# Patient Record
Sex: Female | Born: 1945
Health system: Southern US, Community
[De-identification: ages and names within clinical notes are randomized; demographics above are authoritative.]

## PROBLEM LIST (undated history)

## (undated) DIAGNOSIS — C3490 Malignant neoplasm of unspecified part of unspecified bronchus or lung: Secondary | ICD-10-CM

---

## 2015-07-18 ENCOUNTER — Ambulatory Visit (HOSPITAL_COMMUNITY)
Admission: RE | Admit: 2015-07-18 | Discharge: 2015-07-18 | Disposition: A | Discharge status: Home / Self Care | Payer: Medicare Other | Source: Ambulatory Visit | Attending: Internal Medicine | Admitting: Internal Medicine

## 2015-07-18 ENCOUNTER — Other Ambulatory Visit (HOSPITAL_COMMUNITY): Payer: Self-pay | Admitting: Internal Medicine

## 2015-07-18 DIAGNOSIS — R059 Cough, unspecified: Secondary | ICD-10-CM

## 2015-07-18 DIAGNOSIS — Z1231 Encounter for screening mammogram for malignant neoplasm of breast: Secondary | ICD-10-CM

## 2015-07-18 DIAGNOSIS — R05 Cough: Secondary | ICD-10-CM

## 2015-07-18 DIAGNOSIS — J984 Other disorders of lung: Secondary | ICD-10-CM | POA: Diagnosis not present

## 2015-07-18 DIAGNOSIS — J449 Chronic obstructive pulmonary disease, unspecified: Secondary | ICD-10-CM | POA: Diagnosis not present

## 2015-07-28 ENCOUNTER — Ambulatory Visit (HOSPITAL_COMMUNITY)
Admission: RE | Admit: 2015-07-28 | Discharge: 2015-07-28 | Disposition: A | Discharge status: Home / Self Care | Payer: Medicare Other | Source: Ambulatory Visit | Attending: Internal Medicine | Admitting: Internal Medicine

## 2015-07-28 DIAGNOSIS — Z1231 Encounter for screening mammogram for malignant neoplasm of breast: Secondary | ICD-10-CM | POA: Diagnosis not present

## 2015-08-02 ENCOUNTER — Other Ambulatory Visit: Payer: Self-pay | Admitting: Internal Medicine

## 2015-08-02 DIAGNOSIS — R928 Other abnormal and inconclusive findings on diagnostic imaging of breast: Secondary | ICD-10-CM

## 2015-08-23 ENCOUNTER — Ambulatory Visit (HOSPITAL_COMMUNITY)
Admission: RE | Admit: 2015-08-23 | Discharge: 2015-08-23 | Disposition: A | Discharge status: Home / Self Care | Payer: Medicare Other | Source: Ambulatory Visit | Attending: Internal Medicine | Admitting: Internal Medicine

## 2015-08-23 DIAGNOSIS — R928 Other abnormal and inconclusive findings on diagnostic imaging of breast: Secondary | ICD-10-CM | POA: Diagnosis present

## 2015-10-07 DIAGNOSIS — M8580 Other specified disorders of bone density and structure, unspecified site: Secondary | ICD-10-CM | POA: Diagnosis not present

## 2015-10-07 DIAGNOSIS — E039 Hypothyroidism, unspecified: Secondary | ICD-10-CM | POA: Diagnosis not present

## 2015-10-07 DIAGNOSIS — M79674 Pain in right toe(s): Secondary | ICD-10-CM | POA: Diagnosis not present

## 2015-10-07 DIAGNOSIS — J45909 Unspecified asthma, uncomplicated: Secondary | ICD-10-CM | POA: Diagnosis not present

## 2015-10-07 DIAGNOSIS — S90414A Abrasion, right lesser toe(s), initial encounter: Secondary | ICD-10-CM | POA: Diagnosis not present

## 2016-01-20 DIAGNOSIS — Z1389 Encounter for screening for other disorder: Secondary | ICD-10-CM | POA: Diagnosis not present

## 2016-01-20 DIAGNOSIS — Z299 Encounter for prophylactic measures, unspecified: Secondary | ICD-10-CM | POA: Diagnosis not present

## 2016-01-20 DIAGNOSIS — E039 Hypothyroidism, unspecified: Secondary | ICD-10-CM | POA: Diagnosis not present

## 2016-01-20 DIAGNOSIS — Z1211 Encounter for screening for malignant neoplasm of colon: Secondary | ICD-10-CM | POA: Diagnosis not present

## 2016-01-20 DIAGNOSIS — Z7189 Other specified counseling: Secondary | ICD-10-CM | POA: Diagnosis not present

## 2016-01-20 DIAGNOSIS — Z6822 Body mass index (BMI) 22.0-22.9, adult: Secondary | ICD-10-CM | POA: Diagnosis not present

## 2016-01-20 DIAGNOSIS — E7800 Pure hypercholesterolemia, unspecified: Secondary | ICD-10-CM | POA: Diagnosis not present

## 2016-01-20 DIAGNOSIS — Z0000 Encounter for general adult medical examination without abnormal findings: Secondary | ICD-10-CM | POA: Diagnosis not present

## 2016-01-20 DIAGNOSIS — Z79899 Other long term (current) drug therapy: Secondary | ICD-10-CM | POA: Diagnosis not present

## 2016-02-14 DIAGNOSIS — E2839 Other primary ovarian failure: Secondary | ICD-10-CM | POA: Diagnosis not present

## 2016-02-15 ENCOUNTER — Other Ambulatory Visit (HOSPITAL_COMMUNITY): Payer: Self-pay | Admitting: Internal Medicine

## 2016-02-15 DIAGNOSIS — Z09 Encounter for follow-up examination after completed treatment for conditions other than malignant neoplasm: Secondary | ICD-10-CM

## 2016-02-15 DIAGNOSIS — R921 Mammographic calcification found on diagnostic imaging of breast: Secondary | ICD-10-CM

## 2016-02-21 ENCOUNTER — Ambulatory Visit (HOSPITAL_COMMUNITY)
Admission: RE | Admit: 2016-02-21 | Discharge: 2016-02-21 | Disposition: A | Discharge status: Home / Self Care | Payer: PPO | Source: Ambulatory Visit | Attending: Internal Medicine | Admitting: Internal Medicine

## 2016-02-21 DIAGNOSIS — R921 Mammographic calcification found on diagnostic imaging of breast: Secondary | ICD-10-CM | POA: Insufficient documentation

## 2016-02-21 DIAGNOSIS — Z09 Encounter for follow-up examination after completed treatment for conditions other than malignant neoplasm: Secondary | ICD-10-CM

## 2016-05-04 DIAGNOSIS — M810 Age-related osteoporosis without current pathological fracture: Secondary | ICD-10-CM | POA: Diagnosis not present

## 2016-05-04 DIAGNOSIS — G710 Muscular dystrophy: Secondary | ICD-10-CM | POA: Diagnosis not present

## 2016-05-04 DIAGNOSIS — J45909 Unspecified asthma, uncomplicated: Secondary | ICD-10-CM | POA: Diagnosis not present

## 2016-05-04 DIAGNOSIS — E039 Hypothyroidism, unspecified: Secondary | ICD-10-CM | POA: Diagnosis not present

## 2016-07-10 DIAGNOSIS — Z6823 Body mass index (BMI) 23.0-23.9, adult: Secondary | ICD-10-CM | POA: Diagnosis not present

## 2016-07-10 DIAGNOSIS — Z299 Encounter for prophylactic measures, unspecified: Secondary | ICD-10-CM | POA: Diagnosis not present

## 2016-07-10 DIAGNOSIS — M810 Age-related osteoporosis without current pathological fracture: Secondary | ICD-10-CM | POA: Diagnosis not present

## 2016-07-10 DIAGNOSIS — E039 Hypothyroidism, unspecified: Secondary | ICD-10-CM | POA: Diagnosis not present

## 2016-07-10 DIAGNOSIS — F419 Anxiety disorder, unspecified: Secondary | ICD-10-CM | POA: Diagnosis not present

## 2016-08-03 DIAGNOSIS — M810 Age-related osteoporosis without current pathological fracture: Secondary | ICD-10-CM | POA: Diagnosis not present

## 2017-02-01 DIAGNOSIS — Z1389 Encounter for screening for other disorder: Secondary | ICD-10-CM | POA: Diagnosis not present

## 2017-02-01 DIAGNOSIS — J45909 Unspecified asthma, uncomplicated: Secondary | ICD-10-CM | POA: Diagnosis not present

## 2017-02-01 DIAGNOSIS — E7800 Pure hypercholesterolemia, unspecified: Secondary | ICD-10-CM | POA: Diagnosis not present

## 2017-02-01 DIAGNOSIS — Z79899 Other long term (current) drug therapy: Secondary | ICD-10-CM | POA: Diagnosis not present

## 2017-02-01 DIAGNOSIS — E039 Hypothyroidism, unspecified: Secondary | ICD-10-CM | POA: Diagnosis not present

## 2017-02-01 DIAGNOSIS — Z0000 Encounter for general adult medical examination without abnormal findings: Secondary | ICD-10-CM | POA: Diagnosis not present

## 2017-02-01 DIAGNOSIS — Z1211 Encounter for screening for malignant neoplasm of colon: Secondary | ICD-10-CM | POA: Diagnosis not present

## 2017-02-01 DIAGNOSIS — G710 Muscular dystrophy: Secondary | ICD-10-CM | POA: Diagnosis not present

## 2017-02-01 DIAGNOSIS — F419 Anxiety disorder, unspecified: Secondary | ICD-10-CM | POA: Diagnosis not present

## 2017-02-01 DIAGNOSIS — Z299 Encounter for prophylactic measures, unspecified: Secondary | ICD-10-CM | POA: Diagnosis not present

## 2017-02-01 DIAGNOSIS — Z7189 Other specified counseling: Secondary | ICD-10-CM | POA: Diagnosis not present

## 2017-02-01 DIAGNOSIS — R5383 Other fatigue: Secondary | ICD-10-CM | POA: Diagnosis not present

## 2017-02-13 DIAGNOSIS — M810 Age-related osteoporosis without current pathological fracture: Secondary | ICD-10-CM | POA: Diagnosis not present

## 2017-07-05 DIAGNOSIS — Z6822 Body mass index (BMI) 22.0-22.9, adult: Secondary | ICD-10-CM | POA: Diagnosis not present

## 2017-07-05 DIAGNOSIS — M810 Age-related osteoporosis without current pathological fracture: Secondary | ICD-10-CM | POA: Diagnosis not present

## 2017-07-05 DIAGNOSIS — G7100 Muscular dystrophy, unspecified: Secondary | ICD-10-CM | POA: Diagnosis not present

## 2017-07-05 DIAGNOSIS — Z299 Encounter for prophylactic measures, unspecified: Secondary | ICD-10-CM | POA: Diagnosis not present

## 2017-07-05 DIAGNOSIS — E7800 Pure hypercholesterolemia, unspecified: Secondary | ICD-10-CM | POA: Diagnosis not present

## 2017-07-05 DIAGNOSIS — E039 Hypothyroidism, unspecified: Secondary | ICD-10-CM | POA: Diagnosis not present

## 2017-08-21 DIAGNOSIS — M810 Age-related osteoporosis without current pathological fracture: Secondary | ICD-10-CM | POA: Diagnosis not present

## 2018-02-07 DIAGNOSIS — M810 Age-related osteoporosis without current pathological fracture: Secondary | ICD-10-CM | POA: Diagnosis not present

## 2018-02-07 DIAGNOSIS — Z87891 Personal history of nicotine dependence: Secondary | ICD-10-CM | POA: Diagnosis not present

## 2018-02-07 DIAGNOSIS — Z1331 Encounter for screening for depression: Secondary | ICD-10-CM | POA: Diagnosis not present

## 2018-02-07 DIAGNOSIS — Z1211 Encounter for screening for malignant neoplasm of colon: Secondary | ICD-10-CM | POA: Diagnosis not present

## 2018-02-07 DIAGNOSIS — Z1339 Encounter for screening examination for other mental health and behavioral disorders: Secondary | ICD-10-CM | POA: Diagnosis not present

## 2018-02-07 DIAGNOSIS — Z0000 Encounter for general adult medical examination without abnormal findings: Secondary | ICD-10-CM | POA: Diagnosis not present

## 2018-02-07 DIAGNOSIS — J45909 Unspecified asthma, uncomplicated: Secondary | ICD-10-CM | POA: Diagnosis not present

## 2018-02-07 DIAGNOSIS — Z79899 Other long term (current) drug therapy: Secondary | ICD-10-CM | POA: Diagnosis not present

## 2018-02-07 DIAGNOSIS — Z299 Encounter for prophylactic measures, unspecified: Secondary | ICD-10-CM | POA: Diagnosis not present

## 2018-02-07 DIAGNOSIS — E7800 Pure hypercholesterolemia, unspecified: Secondary | ICD-10-CM | POA: Diagnosis not present

## 2018-02-07 DIAGNOSIS — Z7189 Other specified counseling: Secondary | ICD-10-CM | POA: Diagnosis not present

## 2018-02-07 DIAGNOSIS — E039 Hypothyroidism, unspecified: Secondary | ICD-10-CM | POA: Diagnosis not present

## 2018-02-07 DIAGNOSIS — R5383 Other fatigue: Secondary | ICD-10-CM | POA: Diagnosis not present

## 2018-02-07 DIAGNOSIS — Z6822 Body mass index (BMI) 22.0-22.9, adult: Secondary | ICD-10-CM | POA: Diagnosis not present

## 2018-02-25 DIAGNOSIS — E2839 Other primary ovarian failure: Secondary | ICD-10-CM | POA: Diagnosis not present

## 2018-02-25 DIAGNOSIS — M818 Other osteoporosis without current pathological fracture: Secondary | ICD-10-CM | POA: Diagnosis not present

## 2018-03-06 DIAGNOSIS — Z299 Encounter for prophylactic measures, unspecified: Secondary | ICD-10-CM | POA: Diagnosis not present

## 2018-03-06 DIAGNOSIS — D235 Other benign neoplasm of skin of trunk: Secondary | ICD-10-CM | POA: Diagnosis not present

## 2018-03-06 DIAGNOSIS — Z6822 Body mass index (BMI) 22.0-22.9, adult: Secondary | ICD-10-CM | POA: Diagnosis not present

## 2018-03-19 DIAGNOSIS — M810 Age-related osteoporosis without current pathological fracture: Secondary | ICD-10-CM | POA: Diagnosis not present

## 2018-11-05 ENCOUNTER — Other Ambulatory Visit (HOSPITAL_COMMUNITY): Payer: Self-pay | Admitting: Internal Medicine

## 2018-11-05 DIAGNOSIS — R921 Mammographic calcification found on diagnostic imaging of breast: Secondary | ICD-10-CM

## 2018-11-13 ENCOUNTER — Ambulatory Visit (HOSPITAL_COMMUNITY): Payer: PPO

## 2018-11-13 ENCOUNTER — Encounter (HOSPITAL_COMMUNITY): Payer: PPO

## 2018-11-25 ENCOUNTER — Ambulatory Visit (HOSPITAL_COMMUNITY): Payer: PPO

## 2018-11-25 ENCOUNTER — Ambulatory Visit (HOSPITAL_COMMUNITY)
Admission: RE | Admit: 2018-11-25 | Discharge: 2018-11-25 | Disposition: A | Discharge status: Home / Self Care | Payer: PPO | Source: Ambulatory Visit | Attending: Internal Medicine | Admitting: Internal Medicine

## 2018-11-25 DIAGNOSIS — R5383 Other fatigue: Secondary | ICD-10-CM | POA: Diagnosis not present

## 2018-11-25 DIAGNOSIS — R921 Mammographic calcification found on diagnostic imaging of breast: Secondary | ICD-10-CM | POA: Insufficient documentation

## 2018-11-27 DIAGNOSIS — M810 Age-related osteoporosis without current pathological fracture: Secondary | ICD-10-CM | POA: Diagnosis not present

## 2019-05-13 DIAGNOSIS — Z1331 Encounter for screening for depression: Secondary | ICD-10-CM | POA: Diagnosis not present

## 2019-05-13 DIAGNOSIS — Z1211 Encounter for screening for malignant neoplasm of colon: Secondary | ICD-10-CM | POA: Diagnosis not present

## 2019-05-13 DIAGNOSIS — R5383 Other fatigue: Secondary | ICD-10-CM | POA: Diagnosis not present

## 2019-05-13 DIAGNOSIS — Z6823 Body mass index (BMI) 23.0-23.9, adult: Secondary | ICD-10-CM | POA: Diagnosis not present

## 2019-05-13 DIAGNOSIS — Z299 Encounter for prophylactic measures, unspecified: Secondary | ICD-10-CM | POA: Diagnosis not present

## 2019-05-13 DIAGNOSIS — Z79899 Other long term (current) drug therapy: Secondary | ICD-10-CM | POA: Diagnosis not present

## 2019-05-13 DIAGNOSIS — Z0000 Encounter for general adult medical examination without abnormal findings: Secondary | ICD-10-CM | POA: Diagnosis not present

## 2019-05-13 DIAGNOSIS — Z1339 Encounter for screening examination for other mental health and behavioral disorders: Secondary | ICD-10-CM | POA: Diagnosis not present

## 2019-05-13 DIAGNOSIS — E7800 Pure hypercholesterolemia, unspecified: Secondary | ICD-10-CM | POA: Diagnosis not present

## 2019-05-13 DIAGNOSIS — M810 Age-related osteoporosis without current pathological fracture: Secondary | ICD-10-CM | POA: Diagnosis not present

## 2019-05-13 DIAGNOSIS — Z7189 Other specified counseling: Secondary | ICD-10-CM | POA: Diagnosis not present

## 2019-05-13 DIAGNOSIS — E039 Hypothyroidism, unspecified: Secondary | ICD-10-CM | POA: Diagnosis not present

## 2019-05-13 DIAGNOSIS — J45909 Unspecified asthma, uncomplicated: Secondary | ICD-10-CM | POA: Diagnosis not present

## 2019-06-08 DIAGNOSIS — M810 Age-related osteoporosis without current pathological fracture: Secondary | ICD-10-CM | POA: Diagnosis not present

## 2019-06-16 DIAGNOSIS — Z6823 Body mass index (BMI) 23.0-23.9, adult: Secondary | ICD-10-CM | POA: Diagnosis not present

## 2019-06-16 DIAGNOSIS — G7100 Muscular dystrophy, unspecified: Secondary | ICD-10-CM | POA: Diagnosis not present

## 2019-06-16 DIAGNOSIS — Z85118 Personal history of other malignant neoplasm of bronchus and lung: Secondary | ICD-10-CM | POA: Diagnosis not present

## 2019-06-16 DIAGNOSIS — Z299 Encounter for prophylactic measures, unspecified: Secondary | ICD-10-CM | POA: Diagnosis not present

## 2019-06-16 DIAGNOSIS — I10 Essential (primary) hypertension: Secondary | ICD-10-CM | POA: Diagnosis not present

## 2019-06-16 DIAGNOSIS — E7800 Pure hypercholesterolemia, unspecified: Secondary | ICD-10-CM | POA: Diagnosis not present

## 2019-07-13 DIAGNOSIS — I10 Essential (primary) hypertension: Secondary | ICD-10-CM | POA: Diagnosis not present

## 2019-11-22 DIAGNOSIS — M8580 Other specified disorders of bone density and structure, unspecified site: Secondary | ICD-10-CM | POA: Diagnosis not present

## 2019-11-22 DIAGNOSIS — I10 Essential (primary) hypertension: Secondary | ICD-10-CM | POA: Diagnosis not present

## 2020-02-26 DIAGNOSIS — M810 Age-related osteoporosis without current pathological fracture: Secondary | ICD-10-CM | POA: Diagnosis not present

## 2020-03-23 DIAGNOSIS — G7100 Muscular dystrophy, unspecified: Secondary | ICD-10-CM | POA: Diagnosis not present

## 2020-03-23 DIAGNOSIS — E039 Hypothyroidism, unspecified: Secondary | ICD-10-CM | POA: Diagnosis not present

## 2020-04-03 IMAGING — MG DIGITAL DIAGNOSTIC BILATERAL MAMMOGRAM WITH TOMO AND CAD
6 of 10 series · 6 of 26 positions shown · non-contrast
Comparison: Previous exam(s).

CLINICAL DATA: Follow-up for probably benign left breast
calcifications. Patient has a sister recently diagnosed with stage
IV breast cancer.

EXAM:
DIGITAL DIAGNOSTIC BILATERAL MAMMOGRAM WITH CAD AND TOMO

[L CC]
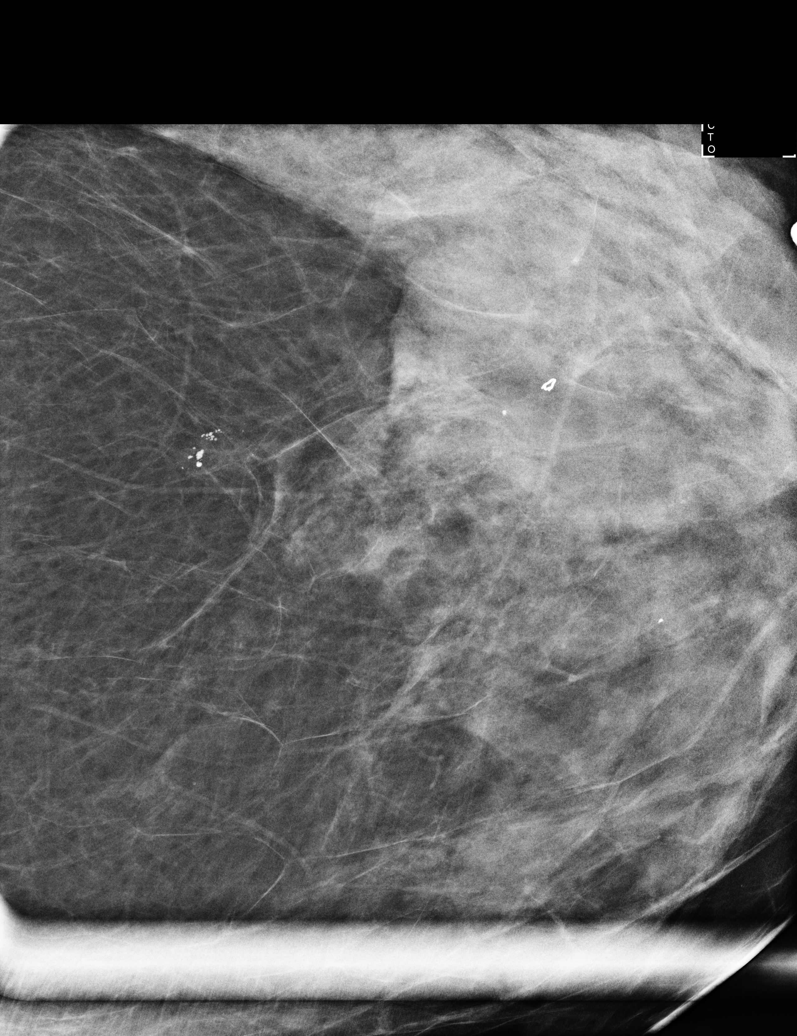

[L ML]
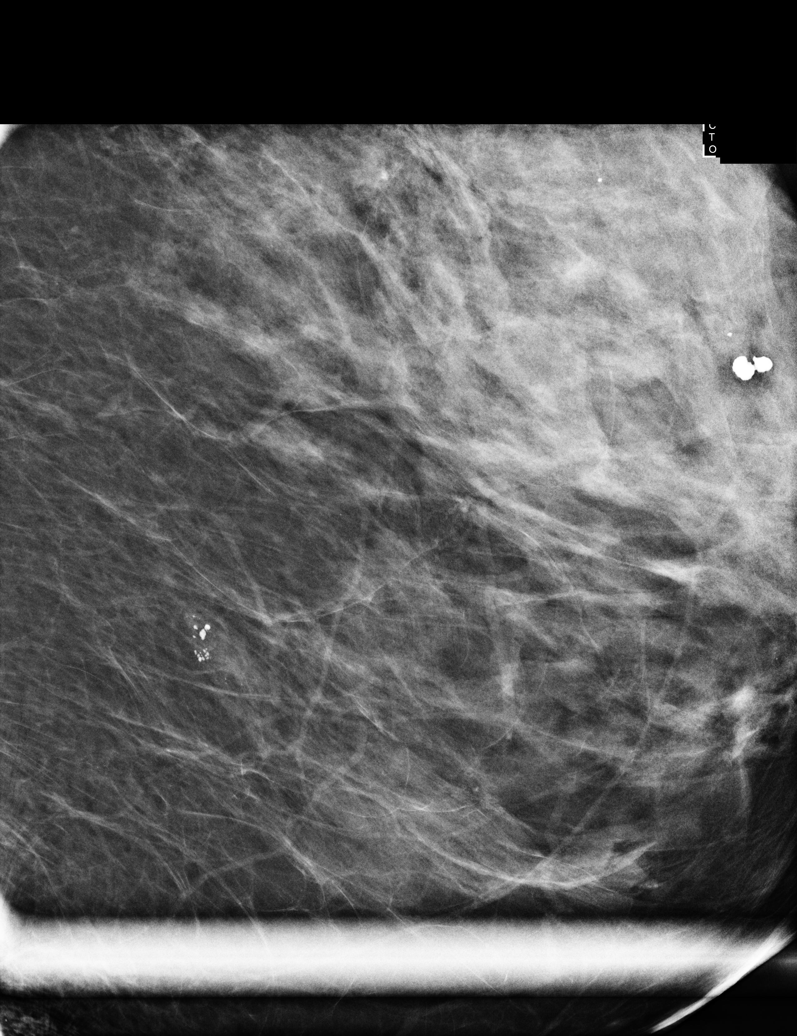

[L CC synth-2D]
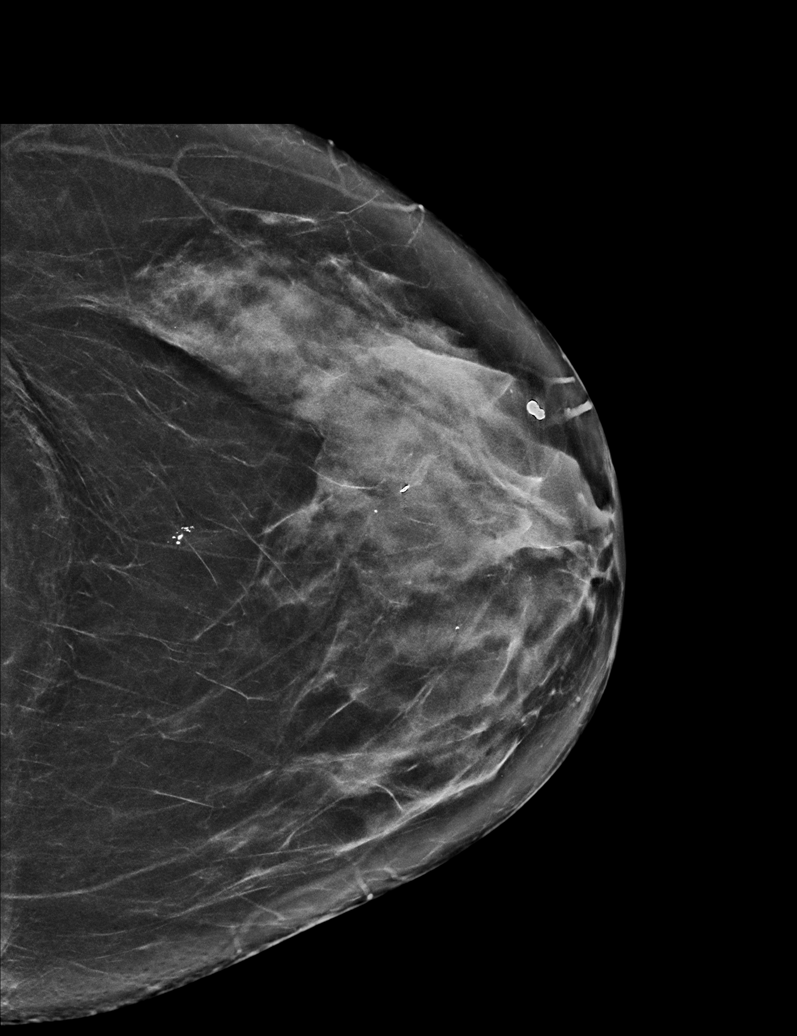

[L MLO synth-2D]
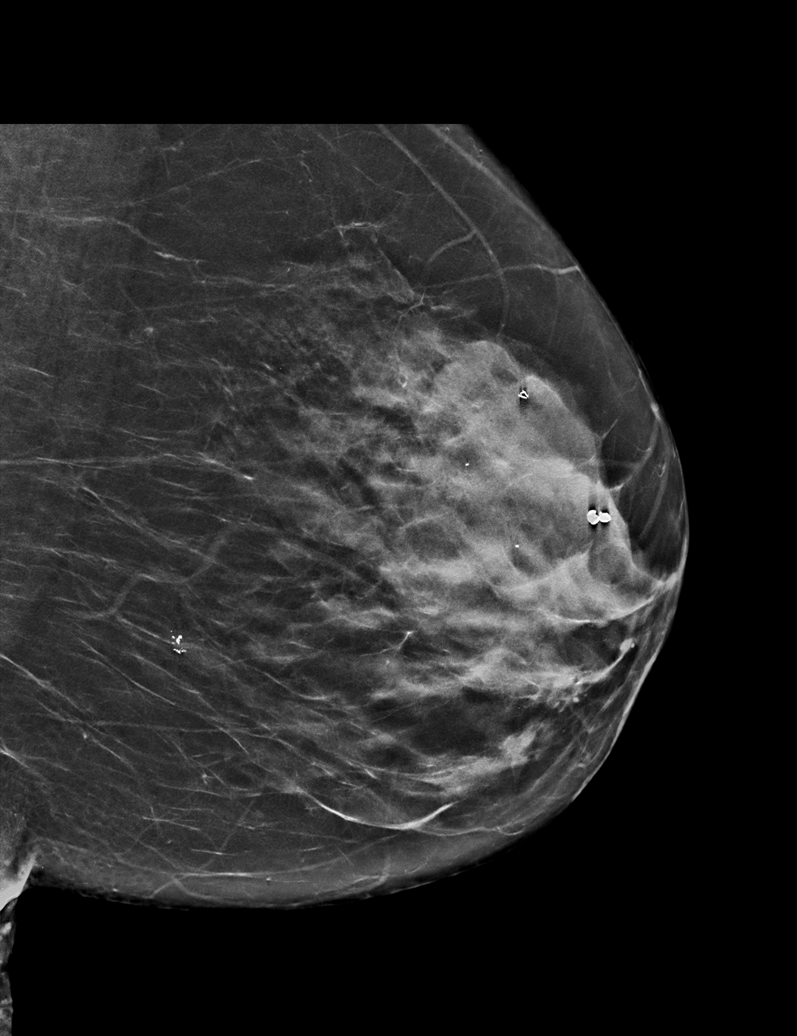

[R MLO synth-2D]
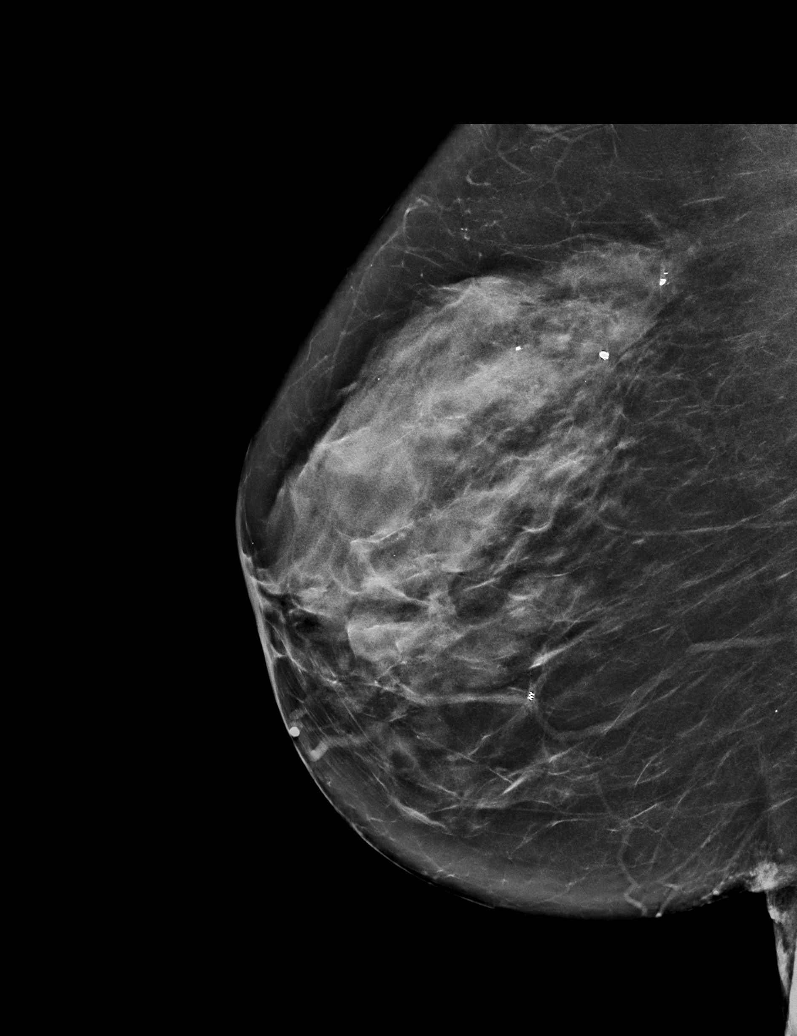

[R CC synth-2D]
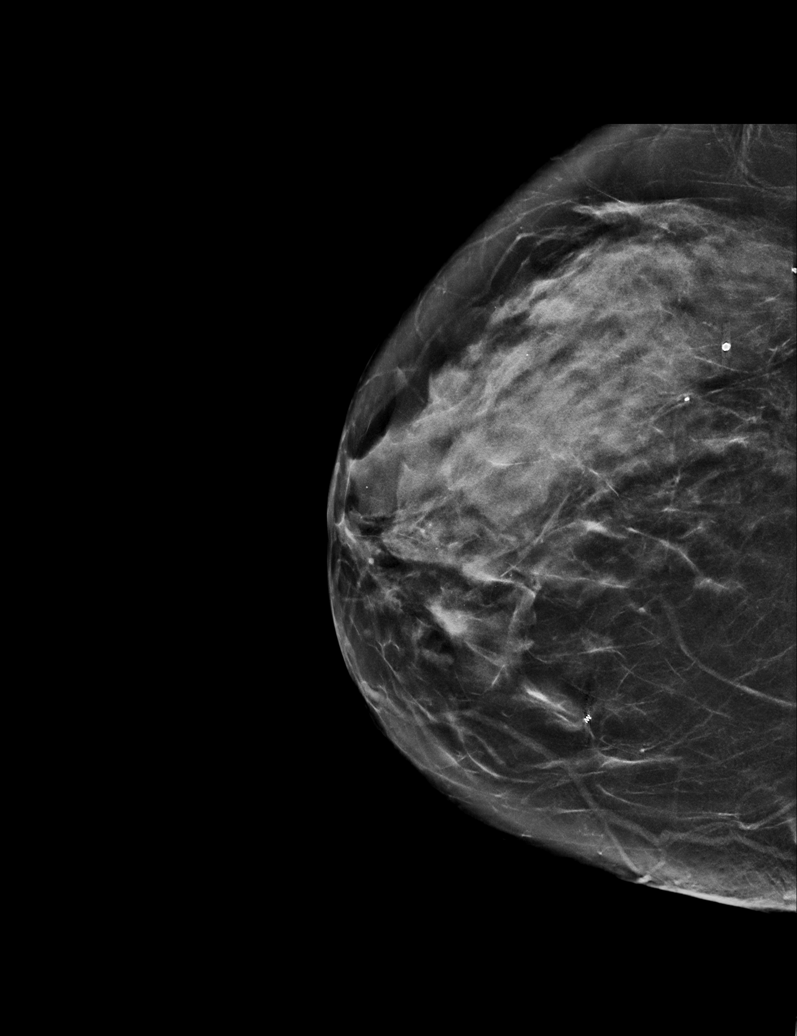

[6 of 26 positions shown; findings below may reference images not displayed]

ACR Breast Density Category c: The breast tissue is heterogeneously
dense, which may obscure small masses.
FINDINGS: No suspicious masses or calcifications are seen in either breast.
Stable postsurgical scarring in the far upper right breast related
to prior thoracotomy. Spot compression magnification views were
performed over the lower central left breast demonstrating evolution
of benign dystrophic type calcifications. There is no mammographic
evidence of malignancy in either breast.

Mammographic images were processed with CAD.
IMPRESSION: No mammographic evidence of malignancy in either breast.

RECOMMENDATION:
Screening mammogram in one year.(Code:[id number])

I have discussed the findings and recommendations with the patient.
Results were also provided in writing at the conclusion of the
visit. If applicable, a reminder letter will be sent to the patient
regarding the next appointment.

BI-RADS CATEGORY  2: Benign.

## 2020-04-22 DIAGNOSIS — G7100 Muscular dystrophy, unspecified: Secondary | ICD-10-CM | POA: Diagnosis not present

## 2020-04-22 DIAGNOSIS — E039 Hypothyroidism, unspecified: Secondary | ICD-10-CM | POA: Diagnosis not present

## 2020-06-16 ENCOUNTER — Ambulatory Visit: Payer: PPO | Attending: Internal Medicine

## 2020-06-16 DIAGNOSIS — Z23 Encounter for immunization: Secondary | ICD-10-CM

## 2020-06-16 NOTE — Progress Notes (Signed)
   Covid-19 Vaccination Clinic  Name:  Megan Lozano    MRN: [id number] DOB: [birth year]  06/16/2020  Megan Lozano was observed post Covid-19 immunization for 15 minutes without incident. She was provided with Vaccine Information Sheet and instruction to access the V-Safe system.   Megan Lozano was instructed to call 911 with any severe reactions post vaccine: Marland Kitchen Difficulty breathing  . Swelling of face and throat  . A fast heartbeat  . A bad rash all over body  . Dizziness and weakness

## 2020-06-23 DIAGNOSIS — E039 Hypothyroidism, unspecified: Secondary | ICD-10-CM | POA: Diagnosis not present

## 2020-06-23 DIAGNOSIS — G7100 Muscular dystrophy, unspecified: Secondary | ICD-10-CM | POA: Diagnosis not present

## 2020-06-29 DIAGNOSIS — Z7189 Other specified counseling: Secondary | ICD-10-CM | POA: Diagnosis not present

## 2020-06-29 DIAGNOSIS — I10 Essential (primary) hypertension: Secondary | ICD-10-CM | POA: Diagnosis not present

## 2020-06-29 DIAGNOSIS — Z79899 Other long term (current) drug therapy: Secondary | ICD-10-CM | POA: Diagnosis not present

## 2020-06-29 DIAGNOSIS — R5383 Other fatigue: Secondary | ICD-10-CM | POA: Diagnosis not present

## 2020-06-29 DIAGNOSIS — Z1331 Encounter for screening for depression: Secondary | ICD-10-CM | POA: Diagnosis not present

## 2020-06-29 DIAGNOSIS — Z0000 Encounter for general adult medical examination without abnormal findings: Secondary | ICD-10-CM | POA: Diagnosis not present

## 2020-06-29 DIAGNOSIS — E039 Hypothyroidism, unspecified: Secondary | ICD-10-CM | POA: Diagnosis not present

## 2020-06-29 DIAGNOSIS — Z299 Encounter for prophylactic measures, unspecified: Secondary | ICD-10-CM | POA: Diagnosis not present

## 2020-06-29 DIAGNOSIS — Z713 Dietary counseling and surveillance: Secondary | ICD-10-CM | POA: Diagnosis not present

## 2020-06-29 DIAGNOSIS — Z1211 Encounter for screening for malignant neoplasm of colon: Secondary | ICD-10-CM | POA: Diagnosis not present

## 2020-06-29 DIAGNOSIS — Z1339 Encounter for screening examination for other mental health and behavioral disorders: Secondary | ICD-10-CM | POA: Diagnosis not present

## 2020-07-22 DIAGNOSIS — G7100 Muscular dystrophy, unspecified: Secondary | ICD-10-CM | POA: Diagnosis not present

## 2020-07-22 DIAGNOSIS — E039 Hypothyroidism, unspecified: Secondary | ICD-10-CM | POA: Diagnosis not present

## 2020-09-23 DIAGNOSIS — E039 Hypothyroidism, unspecified: Secondary | ICD-10-CM | POA: Diagnosis not present

## 2020-09-23 DIAGNOSIS — E559 Vitamin D deficiency, unspecified: Secondary | ICD-10-CM | POA: Diagnosis not present

## 2020-11-21 DIAGNOSIS — E039 Hypothyroidism, unspecified: Secondary | ICD-10-CM | POA: Diagnosis not present

## 2020-11-21 DIAGNOSIS — E559 Vitamin D deficiency, unspecified: Secondary | ICD-10-CM | POA: Diagnosis not present

## 2021-07-04 DIAGNOSIS — M810 Age-related osteoporosis without current pathological fracture: Secondary | ICD-10-CM | POA: Diagnosis not present

## 2021-07-19 DIAGNOSIS — Z87891 Personal history of nicotine dependence: Secondary | ICD-10-CM | POA: Diagnosis not present

## 2021-07-19 DIAGNOSIS — I10 Essential (primary) hypertension: Secondary | ICD-10-CM | POA: Diagnosis not present

## 2021-07-19 DIAGNOSIS — Z299 Encounter for prophylactic measures, unspecified: Secondary | ICD-10-CM | POA: Diagnosis not present

## 2021-07-19 DIAGNOSIS — Z23 Encounter for immunization: Secondary | ICD-10-CM | POA: Diagnosis not present

## 2021-07-19 DIAGNOSIS — R1011 Right upper quadrant pain: Secondary | ICD-10-CM | POA: Diagnosis not present

## 2021-07-19 DIAGNOSIS — Z1331 Encounter for screening for depression: Secondary | ICD-10-CM | POA: Diagnosis not present

## 2021-07-19 DIAGNOSIS — Z1211 Encounter for screening for malignant neoplasm of colon: Secondary | ICD-10-CM | POA: Diagnosis not present

## 2021-07-19 DIAGNOSIS — Z6823 Body mass index (BMI) 23.0-23.9, adult: Secondary | ICD-10-CM | POA: Diagnosis not present

## 2021-07-19 DIAGNOSIS — Z7189 Other specified counseling: Secondary | ICD-10-CM | POA: Diagnosis not present

## 2021-07-19 DIAGNOSIS — Z1339 Encounter for screening examination for other mental health and behavioral disorders: Secondary | ICD-10-CM | POA: Diagnosis not present

## 2021-07-19 DIAGNOSIS — Z0000 Encounter for general adult medical examination without abnormal findings: Secondary | ICD-10-CM | POA: Diagnosis not present

## 2021-07-20 DIAGNOSIS — Z79899 Other long term (current) drug therapy: Secondary | ICD-10-CM | POA: Diagnosis not present

## 2021-07-20 DIAGNOSIS — R918 Other nonspecific abnormal finding of lung field: Secondary | ICD-10-CM | POA: Diagnosis not present

## 2021-07-20 DIAGNOSIS — Z9889 Other specified postprocedural states: Secondary | ICD-10-CM | POA: Diagnosis not present

## 2021-07-20 DIAGNOSIS — Z85118 Personal history of other malignant neoplasm of bronchus and lung: Secondary | ICD-10-CM | POA: Diagnosis not present

## 2021-07-20 DIAGNOSIS — E7800 Pure hypercholesterolemia, unspecified: Secondary | ICD-10-CM | POA: Diagnosis not present

## 2021-07-20 DIAGNOSIS — E559 Vitamin D deficiency, unspecified: Secondary | ICD-10-CM | POA: Diagnosis not present

## 2021-07-20 DIAGNOSIS — E039 Hypothyroidism, unspecified: Secondary | ICD-10-CM | POA: Diagnosis not present

## 2021-07-20 DIAGNOSIS — R079 Chest pain, unspecified: Secondary | ICD-10-CM | POA: Diagnosis not present

## 2021-07-20 DIAGNOSIS — R5383 Other fatigue: Secondary | ICD-10-CM | POA: Diagnosis not present

## 2021-07-20 DIAGNOSIS — R1011 Right upper quadrant pain: Secondary | ICD-10-CM | POA: Diagnosis not present

## 2021-07-20 DIAGNOSIS — R932 Abnormal findings on diagnostic imaging of liver and biliary tract: Secondary | ICD-10-CM | POA: Diagnosis not present

## 2021-07-20 DIAGNOSIS — S22089A Unspecified fracture of T11-T12 vertebra, initial encounter for closed fracture: Secondary | ICD-10-CM | POA: Diagnosis not present

## 2021-07-20 DIAGNOSIS — K7689 Other specified diseases of liver: Secondary | ICD-10-CM | POA: Diagnosis not present

## 2021-07-26 ENCOUNTER — Other Ambulatory Visit: Payer: Self-pay | Admitting: Internal Medicine

## 2021-07-26 DIAGNOSIS — Z139 Encounter for screening, unspecified: Secondary | ICD-10-CM

## 2021-08-01 ENCOUNTER — Ambulatory Visit
Admission: RE | Admit: 2021-08-01 | Discharge: 2021-08-01 | Disposition: A | Discharge status: Home / Self Care | Payer: PPO | Source: Ambulatory Visit | Attending: Internal Medicine | Admitting: Internal Medicine

## 2021-08-01 ENCOUNTER — Other Ambulatory Visit: Payer: Self-pay

## 2021-08-01 DIAGNOSIS — Z1231 Encounter for screening mammogram for malignant neoplasm of breast: Secondary | ICD-10-CM | POA: Diagnosis not present

## 2021-08-01 DIAGNOSIS — Z139 Encounter for screening, unspecified: Secondary | ICD-10-CM

## 2022-02-01 ENCOUNTER — Ambulatory Visit: Payer: Self-pay

## 2022-02-01 DIAGNOSIS — L98499 Non-pressure chronic ulcer of skin of other sites with unspecified severity: Secondary | ICD-10-CM | POA: Diagnosis not present

## 2022-02-01 DIAGNOSIS — S20329A Blister (nonthermal) of unspecified front wall of thorax, initial encounter: Secondary | ICD-10-CM | POA: Diagnosis not present

## 2022-02-01 DIAGNOSIS — T2121XA Burn of second degree of chest wall, initial encounter: Secondary | ICD-10-CM | POA: Diagnosis not present

## 2022-02-01 NOTE — Telephone Encounter (Signed)
? ? ? ?  Chief Complaint: Open wound to right shoulder blade ?Symptoms: Painful, looks yellow. ?Frequency: 1 month ago ?Pertinent Negatives: Patient denies fever ?Disposition: [] ED /[x] Urgent Care (no appt availability in office) / [] Appointment(In office/virtual)/ []  [facility]/ [] Home Care/ [] Refused Recommended Disposition /[] [facility]/ []  Follow-up with PCP ?Additional Notes:   ?Reason for Disposition ? [1] Looks infected AND [2] large red area (> 2 inches or 5 cm) or streak ? ?Answer Assessment - Initial Assessment Questions ?1. APPEARANCE of INJURY: "What does the injury look like?"  ?    Quarter sized ?2. SIZE: "How large is the cut?"  ?    Quarter size ?3. BLEEDING: "Is it bleeding now?" If Yes, ask: "Is it difficult to stop?"  ?    No ?4. LOCATION: "Where is the injury located?"  ?    Right shoulder ?5. ONSET: "How long ago did the injury occur?"  ?    1 month ago ?6. MECHANISM: "Tell me how it happened."  ?    Heating pad ?7. TETANUS: "When was the last tetanus booster?" ?    Unsure ?8. PREGNANCY: "Is there any chance you are pregnant?" "When was your last menstrual period?" ?    No ? ?Protocols used: Skin Injury-A-AH ? ?

## 2022-02-05 ENCOUNTER — Ambulatory Visit: Payer: Self-pay | Admitting: Nurse Practitioner

## 2022-02-06 ENCOUNTER — Ambulatory Visit (HOSPITAL_COMMUNITY): Payer: PPO | Attending: Internal Medicine | Admitting: Physical Therapy

## 2022-02-06 DIAGNOSIS — R2689 Other abnormalities of gait and mobility: Secondary | ICD-10-CM | POA: Insufficient documentation

## 2022-02-06 DIAGNOSIS — S21201S Unspecified open wound of right back wall of thorax without penetration into thoracic cavity, sequela: Secondary | ICD-10-CM | POA: Insufficient documentation

## 2022-02-07 DIAGNOSIS — T1490XD Injury, unspecified, subsequent encounter: Secondary | ICD-10-CM | POA: Diagnosis not present

## 2022-02-07 DIAGNOSIS — T2121XD Burn of second degree of chest wall, subsequent encounter: Secondary | ICD-10-CM | POA: Diagnosis not present

## 2022-02-07 DIAGNOSIS — T148XXD Other injury of unspecified body region, subsequent encounter: Secondary | ICD-10-CM | POA: Diagnosis not present

## 2022-02-08 ENCOUNTER — Ambulatory Visit (HOSPITAL_COMMUNITY): Payer: PPO | Admitting: Physical Therapy

## 2022-02-08 ENCOUNTER — Encounter (HOSPITAL_COMMUNITY): Payer: Self-pay | Admitting: Physical Therapy

## 2022-02-08 DIAGNOSIS — S21201S Unspecified open wound of right back wall of thorax without penetration into thoracic cavity, sequela: Secondary | ICD-10-CM

## 2022-02-08 DIAGNOSIS — R2689 Other abnormalities of gait and mobility: Secondary | ICD-10-CM

## 2022-02-08 NOTE — Therapy (Signed)
[place], [place], [address] Phone: [phone]   Fax:  [phone]  Wound Care Evaluation  Patient Details  Name: Megan Lozano MRN: [id number] Date of Birth: 10/04/1945 Referring Provider (PT): Leisa Lenz MD   Encounter Date: 02/08/2022   PT End of Session - 02/08/22 1503     Visit Number 1    Number of Visits 8    Date for PT Re-Evaluation 03/08/22    Authorization Type [employer] (no visit limit, no prior auth req.)    PT Start Time 1435    PT Stop Time 1501    PT Time Calculation (min) 26 min    Activity Tolerance Patient tolerated treatment well    Behavior During Therapy [facility] for tasks assessed/performed             History reviewed. No pertinent past medical history.  History reviewed. No pertinent surgical history.  There were no vitals filed for this visit.     [facility] PT Assessment - 02/08/22 0001       Assessment   Medical Diagnosis Abnormal wound healing on back    Referring Provider (PT) Leisa Lenz MD    Onset Date/Surgical Date 12/09/21             Wound Therapy - 02/08/22 0001     Subjective Patient had gotten wound on back about 2 months ago. She thinks it may be from having heating pad on back. She went to MD who cut stuff off wound. is now on antibiotics    Patient and Family Stated Goals wound to heal    Date of Onset 12/09/21    Prior Treatments neosporin, xeroform    Pain Scale 0-10    Pain Score 0-No pain    Evaluation and Treatment Procedures Explained to Patient/Family Yes    Evaluation and Treatment Procedures agreed to    Wound Properties Date First Assessed: 02/08/22 Time First Assessed: 1435 Wound Type: Burn Location: Back Location Orientation: Lateral;Right;Upper Wound Description (Comments): R back/axillary wound Present on Admission: Yes   Wound Image Images linked: 1    Dressing Type Impregnated gauze (bismuth)    Dressing Changed  Changed    Dressing Status Old drainage    Dressing Change Frequency PRN    Site / Wound Assessment Yellow;Red;Pink    % Wound base Red or Granulating 30%    % Wound base Yellow/Fibrinous Exudate 70%    Wound Length (cm) 1.2 cm    Wound Width (cm) 1 cm    Wound Surface Area (cm^2) 1.2 cm^2    Margins Unattached edges (unapproximated)    Drainage Amount Scant    Drainage Description Serous    Treatment Cleansed;Debridement (Selective)    Selective Debridement - Location slough in wound bed    Selective Debridement - Tools Used Forceps    Selective Debridement - Tissue Removed slough    Wound Therapy - Clinical Statement Patient with wound that developed about 2 months ago after leaving heating pad on back for too long. She had area debrided by urgent care MD and was given xeroform to dress wound with. Patient presents today with wound mostly covered in slough. Cleansed and debrided some slough but some remained as it was very adherent in wound bed. Dressed wound with medihoney on 2x2 and medipore tape. Educated patient on keeping dressing dry and clean and only changing if soiled. Gave patient medihoney incase she needed to change dressing and  told to bring back next session. Patient will benefit from PT in order to promote wound healing    Wound Therapy - Functional Problem List dressing bathing, sleeping    Factors Delaying/Impairing Wound Healing Multiple medical problems    Hydrotherapy Plan Debridement;Dressing change;Electrical stimulation;Patient/family education;Pulsatile lavage with suction    Wound Therapy - Frequency 2X / week    Wound Therapy - Current Recommendations PT    Wound Plan debride and dressing changes    Dressing  medihoney, vaseline to perimeter,  2x2, medipore                   Objective measurements completed on examination: See above findings.               PT Short Term Goals - 02/08/22 1504       PT SHORT TERM GOAL #1   Title  Patient's wound will be free from slough.    Time 2    Period Weeks    Status New    Target Date 02/22/22               PT Long Term Goals - 02/08/22 1504       PT LONG TERM GOAL #1   Title Patient's wound to be healed.    Time 4    Period Weeks    Status New    Target Date 03/08/22                    Patient will benefit from skilled therapeutic intervention in order to improve the following deficits and impairments:     Visit Diagnosis: Wound of right side of back, sequela  Other abnormalities of gait and mobility    Problem List There are no problems to display for this patient.   Vianne Bulls Perrie Ragin, PT 02/08/2022, 3:19 PM  [place] [address] [place], [place], [address] Phone: [phone]   Fax:  [phone]  Name: Megan Lozano MRN: [id number] Date of Birth: 14-Feb-1946

## 2022-02-12 ENCOUNTER — Ambulatory Visit (HOSPITAL_COMMUNITY): Payer: PPO | Admitting: Physical Therapy

## 2022-02-12 ENCOUNTER — Encounter (HOSPITAL_COMMUNITY): Payer: Self-pay | Admitting: Physical Therapy

## 2022-02-12 DIAGNOSIS — S21201S Unspecified open wound of right back wall of thorax without penetration into thoracic cavity, sequela: Secondary | ICD-10-CM | POA: Diagnosis not present

## 2022-02-12 DIAGNOSIS — R2689 Other abnormalities of gait and mobility: Secondary | ICD-10-CM

## 2022-02-12 NOTE — Therapy (Signed)
[place], [place], [address] Phone: [phone]   Fax:  [phone]  Wound Care Therapy  Patient Details  Name: Megan Lozano MRN: [id number] Date of Birth: 01-27-1946 Referring Provider (PT): Leisa Lenz MD   Encounter Date: 02/12/2022   PT End of Session - 02/12/22 1310     Visit Number 2    Number of Visits 8    Date for PT Re-Evaluation 03/08/22    Authorization Type [employer] (no visit limit, no prior auth req.)    PT Start Time 1312    PT Stop Time 1328    PT Time Calculation (min) 16 min    Activity Tolerance Patient tolerated treatment well    Behavior During Therapy [facility] for tasks assessed/performed             History reviewed. No pertinent past medical history.  History reviewed. No pertinent surgical history.  There were no vitals filed for this visit.               Wound Therapy - 02/12/22 0001     Subjective Dressing stayed in tact.    Patient and Family Stated Goals wound to heal    Date of Onset 12/09/21    Prior Treatments neosporin, xeroform    Evaluation and Treatment Procedures Explained to Patient/Family Yes    Evaluation and Treatment Procedures agreed to    Wound Properties Date First Assessed: 02/08/22 Time First Assessed: 1435 Wound Type: Burn Location: Back Location Orientation: Lateral;Right;Upper Wound Description (Comments): R back/axillary wound Present on Admission: Yes   Wound Image Images linked: 1    Dressing Type Gauze (Comment)   with medihoney   Dressing Changed Changed    Dressing Status Old drainage    Dressing Change Frequency PRN    Site / Wound Assessment Yellow;Red;Pink    % Wound base Red or Granulating 50%    % Wound base Yellow/Fibrinous Exudate 50%    Margins Unattached edges (unapproximated)    Drainage Amount Scant    Drainage Description Serous    Treatment Cleansed;Debridement (Selective)    Selective Debridement  - Location slough in wound bed    Selective Debridement - Tools Used Forceps    Selective Debridement - Tissue Removed slough    Wound Therapy - Clinical Statement Wound is improving in granulation with continued slough present. Tolerates debridement well with some adherent slough remaining. Continued with medihoney on 2x2 with medipore. Gave patient medihoney incase she needed to change dressing and told to bring back next session. Patient will benefit from PT in order to promote wound healing    Wound Therapy - Functional Problem List dressing bathing, sleeping    Factors Delaying/Impairing Wound Healing Multiple medical problems    Hydrotherapy Plan Debridement;Dressing change;Electrical stimulation;Patient/family education;Pulsatile lavage with suction    Wound Therapy - Frequency 2X / week    Wound Therapy - Current Recommendations PT    Wound Plan debride and dressing changes    Dressing  medihoney, 2x2, medipore                       PT Short Term Goals - 02/08/22 1504       PT SHORT TERM GOAL #1   Title Patient's wound will be free from slough.    Time 2    Period Weeks    Status New    Target Date 02/22/22  PT Long Term Goals - 02/08/22 1504       PT LONG TERM GOAL #1   Title Patient's wound to be healed.    Time 4    Period Weeks    Status New    Target Date 03/08/22                    Patient will benefit from skilled therapeutic intervention in order to improve the following deficits and impairments:     Visit Diagnosis: Wound of right side of back, sequela  Other abnormalities of gait and mobility     Problem List There are no problems to display for this patient.   1:41 PM, 02/12/22 Mearl Latin PT, DPT Physical Therapist at [place], [place], [address] Phone: [phone]   Fax:   [phone]  Name: Megan Lozano MRN: [id number] Date of Birth: 20-Jan-1946

## 2022-02-13 ENCOUNTER — Encounter (HOSPITAL_BASED_OUTPATIENT_CLINIC_OR_DEPARTMENT_OTHER): Payer: PPO | Admitting: Internal Medicine

## 2022-02-16 ENCOUNTER — Encounter (HOSPITAL_COMMUNITY): Payer: Self-pay

## 2022-02-16 ENCOUNTER — Ambulatory Visit (HOSPITAL_COMMUNITY): Payer: PPO | Attending: Preventative Medicine

## 2022-02-16 DIAGNOSIS — R2689 Other abnormalities of gait and mobility: Secondary | ICD-10-CM | POA: Diagnosis not present

## 2022-02-16 DIAGNOSIS — S21201S Unspecified open wound of right back wall of thorax without penetration into thoracic cavity, sequela: Secondary | ICD-10-CM | POA: Diagnosis not present

## 2022-02-16 NOTE — Therapy (Signed)
[place], [place], [address] Phone: [phone]   Fax:  [phone]  Wound Care Therapy  Patient Details  Name: Megan Lozano MRN: [id number] Date of Birth: 04-15-1946 Referring Provider (PT): Leisa Lenz MD   Encounter Date: 02/16/2022   PT End of Session - 02/16/22 1823     Visit Number 3    Number of Visits 8    Date for PT Re-Evaluation 03/08/22    Authorization Type [employer] (no visit limit, no prior auth req.)    PT Start Time 1194    PT Stop Time 1735    PT Time Calculation (min) 20 min    Activity Tolerance Patient tolerated treatment well    Behavior During Therapy [facility] for tasks assessed/performed             History reviewed. No pertinent past medical history.  History reviewed. No pertinent surgical history.  There were no vitals filed for this visit.    Subjective Assessment - 02/16/22 1819     Subjective Pt arrived with dressings intact and remember medihoney this session.  No reoprts of pain today.    Currently in Pain? No/denies                       Wound Therapy - 02/16/22 0001     Subjective Pt arrived with dressings intact and remember medihoney this session.  No reoprts of pain today.    Patient and Family Stated Goals wound to heal    Date of Onset 12/09/21    Prior Treatments neosporin, xeroform    Pain Scale 0-10    Pain Score 0-No pain    Evaluation and Treatment Procedures Explained to Patient/Family Yes    Evaluation and Treatment Procedures agreed to    Wound Properties Date First Assessed: 02/08/22 Time First Assessed: 1435 Wound Type: Burn Location: Back Location Orientation: Lateral;Right;Upper Wound Description (Comments): R back/axillary wound Present on Admission: Yes   Wound Image Images linked: 1    Dressing Type Gauze (Comment)   medihoney, vaseline perimeter, medipore tape   Dressing Changed Changed    Dressing Status Old  drainage    Dressing Change Frequency PRN    Site / Wound Assessment Yellow;Red;Pink    % Wound base Red or Granulating 60%    % Wound base Yellow/Fibrinous Exudate 40%    Wound Length (cm) 1 cm    Wound Width (cm) 0.9 cm    Wound Surface Area (cm^2) 0.9 cm^2    Margins Unattached edges (unapproximated)    Drainage Amount Scant    Drainage Description Serous    Treatment Cleansed;Debridement (Selective)    Selective Debridement - Location slough in wound bed    Selective Debridement - Tools Used Forceps;Scalpel    Selective Debridement - Tissue Removed slough    Wound Therapy - Clinical Statement Improved granulation following debridment, does continue to have adherent slough in 3 main areas of wound. Noted induration around wound, manual techniques complete to address hardness.  Continued with medihoney to address slough wiht 2x2 and medipore tape.    Wound Therapy - Functional Problem List dressing bathing, sleeping    Factors Delaying/Impairing Wound Healing Multiple medical problems    Hydrotherapy Plan Debridement;Dressing change;Electrical stimulation;Patient/family education;Pulsatile lavage with suction    Wound Therapy - Frequency 2X / week    Wound Therapy - Current Recommendations PT    Wound Plan debride and dressing changes.  May be ready to transition to xeroform next session(s)    Dressing  medihoney, 2x2, medipore                       PT Short Term Goals - 02/08/22 1504       PT SHORT TERM GOAL #1   Title Patient's wound will be free from slough.    Time 2    Period Weeks    Status New    Target Date 02/22/22               PT Long Term Goals - 02/08/22 1504       PT LONG TERM GOAL #1   Title Patient's wound to be healed.    Time 4    Period Weeks    Status New    Target Date 03/08/22                    Patient will benefit from skilled therapeutic intervention in order to improve the following deficits and impairments:      Visit Diagnosis: Wound of right side of back, sequela  Other abnormalities of gait and mobility     Problem List There are no problems to display for this patient.  Ihor Austin, LPTA/CLT; CBIS [phone]  Aldona Lento, PTA 02/16/2022, 6:24 PM  [place] [address] [place], [place], [address] Phone: [phone]   Fax:  [phone]  Name: Megan Lozano MRN: [id number] Date of Birth: 12-18-1945

## 2022-02-22 ENCOUNTER — Encounter (HOSPITAL_COMMUNITY): Payer: Self-pay

## 2022-02-22 ENCOUNTER — Ambulatory Visit (HOSPITAL_COMMUNITY): Payer: PPO | Attending: Preventative Medicine

## 2022-02-22 DIAGNOSIS — R2689 Other abnormalities of gait and mobility: Secondary | ICD-10-CM | POA: Diagnosis not present

## 2022-02-22 DIAGNOSIS — S21201S Unspecified open wound of right back wall of thorax without penetration into thoracic cavity, sequela: Secondary | ICD-10-CM | POA: Diagnosis not present

## 2022-02-22 NOTE — Therapy (Signed)
[place], [place], [address] Phone: [phone]   Fax:  [phone]  Wound Care Therapy  Patient Details  Name: Megan Lozano MRN: [id number] Date of Birth: 1946/07/21 Referring Provider (PT): Leisa Lenz MD   Encounter Date: 02/22/2022   PT End of Session - 02/22/22 1516     Visit Number 4    Number of Visits 8    Date for PT Re-Evaluation 03/08/22    Authorization Type [employer] (no visit limit, no prior auth req.)    PT Start Time 6834    PT Stop Time 1510    PT Time Calculation (min) 22 min    Activity Tolerance Patient tolerated treatment well    Behavior During Therapy [facility] for tasks assessed/performed             History reviewed. No pertinent past medical history.  History reviewed. No pertinent surgical history.  There were no vitals filed for this visit.    Subjective Assessment - 02/22/22 1512     Subjective Pt stated she mowed yesterday, a little sore following.  No reports of pain in wound today.    Currently in Pain? No/denies                       Wound Therapy - 02/22/22 0001     Subjective Pt stated she mowed yesterday, a little sore following.  No reports of pain in wound today.    Patient and Family Stated Goals wound to heal    Date of Onset 12/09/21    Prior Treatments neosporin, xeroform    Pain Scale 0-10    Pain Score 0-No pain    Evaluation and Treatment Procedures Explained to Patient/Family Yes    Evaluation and Treatment Procedures agreed to    Wound Properties Date First Assessed: 02/08/22 Time First Assessed: 1435 Wound Type: Burn Location: Back Location Orientation: Lateral;Right;Upper Wound Description (Comments): R back/axillary wound Present on Admission: Yes   Wound Image Images linked: 1    Dressing Type Bismuth petroleum;Gauze (Comment)   vaseline perimeter, xeroform 2x2, medipore tape   Dressing Changed Changed    Dressing  Status Old drainage    Dressing Change Frequency PRN    Site / Wound Assessment Pink;Red;Granulation tissue    % Wound base Red or Granulating 100%    % Wound base Yellow/Fibrinous Exudate 0%    Wound Length (cm) 0.6 cm    Wound Width (cm) 0.7 cm    Wound Surface Area (cm^2) 0.42 cm^2    Margins Unattached edges (unapproximated)    Drainage Amount Scant    Drainage Description Serous    Treatment Cleansed;Debridement (Selective)    Selective Debridement - Location slough in wound bed    Selective Debridement - Tools Used Forceps    Selective Debridement - Tissue Removed slough    Wound Therapy - Clinical Statement Vast improvements with granulation tissue following debridment, able to remove all slough from wound bed and dry skin perimeter.  Changed to xeroform with 2x2 and medipore tape.  No reports of pain through session.    Wound Therapy - Functional Problem List dressing bathing, sleeping    Factors Delaying/Impairing Wound Healing Multiple medical problems    Hydrotherapy Plan Debridement;Dressing change;Electrical stimulation;Patient/family education;Pulsatile lavage with suction    Wound Therapy - Frequency 2X / week    Wound Therapy - Current Recommendations PT    Wound Plan debride and  dressing changes.    Dressing  xeroform, vaseline perimeter, 2x2, medipore tape                       PT Short Term Goals - 02/08/22 1504       PT SHORT TERM GOAL #1   Title Patient's wound will be free from slough.    Time 2    Period Weeks    Status New    Target Date 02/22/22               PT Long Term Goals - 02/08/22 1504       PT LONG TERM GOAL #1   Title Patient's wound to be healed.    Time 4    Period Weeks    Status New    Target Date 03/08/22                    Patient will benefit from skilled therapeutic intervention in order to improve the following deficits and impairments:     Visit Diagnosis: Wound of right side of back,  sequela  Other abnormalities of gait and mobility     Problem List There are no problems to display for this patient.  Ihor Austin, LPTA/CLT; CBIS [phone]  Aldona Lento, PTA 02/22/2022, 3:17 PM  [place] [address] [place], [place], [address] Phone: [phone]   Fax:  [phone]  Name: Megan Lozano MRN: [id number] Date of Birth: 1946/07/17

## 2022-02-27 ENCOUNTER — Ambulatory Visit (HOSPITAL_COMMUNITY): Payer: PPO | Attending: Internal Medicine

## 2022-02-27 ENCOUNTER — Encounter (HOSPITAL_COMMUNITY): Payer: Self-pay

## 2022-02-27 DIAGNOSIS — S21201S Unspecified open wound of right back wall of thorax without penetration into thoracic cavity, sequela: Secondary | ICD-10-CM | POA: Insufficient documentation

## 2022-02-27 DIAGNOSIS — R2689 Other abnormalities of gait and mobility: Secondary | ICD-10-CM | POA: Insufficient documentation

## 2022-02-27 NOTE — Therapy (Addendum)
[facility] [place], [place], [address] Phone: [phone]   Fax:  [phone]  Wound Care Therapy  Patient Details  Name: Megan Lozano MRN: [id number] Date of Birth: 01-10-1946 Referring Provider (PT): Leisa Lenz MD   Encounter Date: 02/27/2022  PHYSICAL THERAPY DISCHARGE SUMMARY  Visits from Start of Care: 5  Current functional level related to goals / functional outcomes: See below   Remaining deficits: See below   Education / Equipment: See below   Patient agrees to discharge. Patient goals were partially met. Patient is being discharged due to  d/c to self care.    PT End of Session - 02/27/22 0859     Visit Number 5    Number of Visits 8    Date for PT Re-Evaluation 03/08/22    Authorization Type [employer] (no visit limit, no prior auth req.)    PT Start Time 0825    PT Stop Time 0840    PT Time Calculation (min) 15 min    Activity Tolerance Patient tolerated treatment well    Behavior During Therapy [facility] for tasks assessed/performed             History reviewed. No pertinent past medical history.  History reviewed. No pertinent surgical history.  There were no vitals filed for this visit.    Subjective Assessment - 02/27/22 0852     Subjective Pt stated pain that moves around back, no reports of pain currently.                       Wound Therapy - 02/27/22 0001     Subjective Pt stated pain that moves around back, no reports of pain currently.  \    Patient and Family Stated Goals wound to heal    Date of Onset 12/09/21    Prior Treatments neosporin, xeroform    Pain Scale 0-10    Pain Score 0-No pain    Evaluation and Treatment Procedures Explained to Patient/Family Yes    Evaluation and Treatment Procedures agreed to    Wound Properties Date First Assessed: 02/08/22 Time First Assessed: 1435 Wound Type: Burn Location: Back Location Orientation:  Lateral;Right;Upper Wound Description (Comments): R back/axillary wound Present on Admission: Yes   Wound Image Images linked: 1    Dressing Type Bismuth petroleum   Vaseline, xeroform, 2x2, medipore tape   Dressing Changed Changed    Dressing Status Old drainage    Dressing Change Frequency PRN    Site / Wound Assessment Granulation tissue    % Wound base Red or Granulating 100%    Wound Length (cm) 0.2 cm    Wound Width (cm) 0.4 cm    Wound Surface Area (cm^2) 0.08 cm^2    Margins Attached edges (approximated)    Drainage Amount None    Treatment Cleansed    Selective Debridement - Location cleansed wound, removal of dry skin superficial layer to assure wound healed    Selective Debridement - Tools Used Forceps    Wound Therapy - Clinical Statement Upon removal of dressings wound 100% granulation tissues.  Removal of superficial dry skin.  Discussed self care techniques, to continue with xeroform and to keep dry and covered until fully healed.  No skilled intervention needed.    Wound Therapy - Functional Problem List dressing bathing, sleeping    Factors Delaying/Impairing Wound Healing Multiple medical problems    Hydrotherapy Plan Debridement;Dressing change;Electrical stimulation;Patient/family education;Pulsatile lavage  with suction    Wound Therapy - Frequency 2X / week    Wound Therapy - Current Recommendations PT    Wound Plan DC to self care.    Dressing  xeroform, vaseline perimeter, 2x2, medipore tape               Assessment: Patient has met 1/1 short term goal and 0/1 long term goals with wound improving in granulation without slough present. Patient wound has decreased in size and appears to be healing well at this time. Wound is very small at this point and patient agreeable to self care. PTA educated patient on care needed to promote wound healing. Patient discharged from physical therapy at this time.   1:41 PM, 02/27/22 Mearl Latin PT, DPT Physical  Therapist at [facility]        PT Short Term Goals - 02/27/22 1338       PT SHORT TERM GOAL #1   Title Patient's wound will be free from slough.    Time 2    Period Weeks    Status Achieved    Target Date 02/22/22               PT Long Term Goals - 02/27/22 1338       PT LONG TERM GOAL #1   Title Patient's wound to be healed.    Time 4    Period Weeks    Status On-going    Target Date 03/08/22                    Patient will benefit from skilled therapeutic intervention in order to improve the following deficits and impairments:     Visit Diagnosis: Wound of right side of back, sequela  Other abnormalities of gait and mobility     Problem List There are no problems to display for this patient.  Megan Lozano, LPTA/CLT; CBIS [phone]  Megan Lozano, PT 02/27/2022, 1:38 PM  [facility] [address] [place], [place], [address] Phone: [phone]   Fax:  [phone]  Name: Megan Lozano MRN: [id number] Date of Birth: September 09, 1946

## 2022-03-01 ENCOUNTER — Ambulatory Visit (HOSPITAL_COMMUNITY): Payer: PPO

## 2022-03-06 ENCOUNTER — Ambulatory Visit (HOSPITAL_COMMUNITY): Payer: PPO | Admitting: Physical Therapy

## 2022-03-08 ENCOUNTER — Ambulatory Visit (HOSPITAL_COMMUNITY): Payer: PPO

## 2022-04-06 DIAGNOSIS — I10 Essential (primary) hypertension: Secondary | ICD-10-CM | POA: Diagnosis not present

## 2022-04-06 DIAGNOSIS — N644 Mastodynia: Secondary | ICD-10-CM | POA: Diagnosis not present

## 2022-04-06 DIAGNOSIS — R079 Chest pain, unspecified: Secondary | ICD-10-CM | POA: Diagnosis not present

## 2022-04-06 DIAGNOSIS — Z299 Encounter for prophylactic measures, unspecified: Secondary | ICD-10-CM | POA: Diagnosis not present

## 2022-04-06 DIAGNOSIS — R0789 Other chest pain: Secondary | ICD-10-CM | POA: Diagnosis not present

## 2022-04-09 ENCOUNTER — Other Ambulatory Visit (HOSPITAL_COMMUNITY): Payer: Self-pay | Admitting: Internal Medicine

## 2022-04-09 ENCOUNTER — Other Ambulatory Visit: Payer: Self-pay | Admitting: Internal Medicine

## 2022-04-09 DIAGNOSIS — R079 Chest pain, unspecified: Secondary | ICD-10-CM

## 2022-04-18 ENCOUNTER — Ambulatory Visit (HOSPITAL_COMMUNITY)
Admission: RE | Admit: 2022-04-18 | Discharge: 2022-04-18 | Disposition: A | Discharge status: Home / Self Care | Payer: PPO | Source: Ambulatory Visit | Attending: Internal Medicine | Admitting: Internal Medicine

## 2022-04-18 DIAGNOSIS — R079 Chest pain, unspecified: Secondary | ICD-10-CM | POA: Diagnosis not present

## 2022-04-18 DIAGNOSIS — I700 Atherosclerosis of aorta: Secondary | ICD-10-CM | POA: Diagnosis not present

## 2022-04-18 DIAGNOSIS — J90 Pleural effusion, not elsewhere classified: Secondary | ICD-10-CM | POA: Diagnosis not present

## 2022-04-18 DIAGNOSIS — Z85110 Personal history of malignant carcinoid tumor of bronchus and lung: Secondary | ICD-10-CM | POA: Diagnosis not present

## 2022-04-18 DIAGNOSIS — J439 Emphysema, unspecified: Secondary | ICD-10-CM | POA: Diagnosis not present

## 2022-06-28 DIAGNOSIS — R222 Localized swelling, mass and lump, trunk: Secondary | ICD-10-CM | POA: Diagnosis not present

## 2022-06-28 DIAGNOSIS — I10 Essential (primary) hypertension: Secondary | ICD-10-CM | POA: Diagnosis not present

## 2022-06-28 DIAGNOSIS — R1011 Right upper quadrant pain: Secondary | ICD-10-CM | POA: Diagnosis not present

## 2022-06-28 DIAGNOSIS — Z299 Encounter for prophylactic measures, unspecified: Secondary | ICD-10-CM | POA: Diagnosis not present

## 2022-06-28 DIAGNOSIS — R079 Chest pain, unspecified: Secondary | ICD-10-CM | POA: Diagnosis not present

## 2022-07-05 ENCOUNTER — Encounter: Payer: Self-pay | Admitting: Hematology

## 2022-07-05 ENCOUNTER — Inpatient Hospital Stay: Payer: PPO | Attending: Hematology | Admitting: Hematology

## 2022-07-05 ENCOUNTER — Other Ambulatory Visit: Payer: Self-pay

## 2022-07-05 VITALS — BP 152/93 | HR 94 | Temp 97.6°F | Resp 18 | Ht 63.19 in | Wt 143.1 lb

## 2022-07-05 DIAGNOSIS — C3490 Malignant neoplasm of unspecified part of unspecified bronchus or lung: Secondary | ICD-10-CM

## 2022-07-05 DIAGNOSIS — Z85118 Personal history of other malignant neoplasm of bronchus and lung: Secondary | ICD-10-CM | POA: Diagnosis not present

## 2022-07-05 DIAGNOSIS — Z8041 Family history of malignant neoplasm of ovary: Secondary | ICD-10-CM | POA: Diagnosis not present

## 2022-07-05 DIAGNOSIS — R222 Localized swelling, mass and lump, trunk: Secondary | ICD-10-CM

## 2022-07-05 DIAGNOSIS — Z8379 Family history of other diseases of the digestive system: Secondary | ICD-10-CM | POA: Diagnosis not present

## 2022-07-05 DIAGNOSIS — Z803 Family history of malignant neoplasm of breast: Secondary | ICD-10-CM | POA: Diagnosis not present

## 2022-07-05 DIAGNOSIS — Z79899 Other long term (current) drug therapy: Secondary | ICD-10-CM | POA: Diagnosis not present

## 2022-07-05 DIAGNOSIS — Z79890 Hormone replacement therapy (postmenopausal): Secondary | ICD-10-CM | POA: Diagnosis not present

## 2022-07-05 DIAGNOSIS — J90 Pleural effusion, not elsewhere classified: Secondary | ICD-10-CM | POA: Diagnosis not present

## 2022-07-05 DIAGNOSIS — Z8249 Family history of ischemic heart disease and other diseases of the circulatory system: Secondary | ICD-10-CM | POA: Diagnosis not present

## 2022-07-05 MED ORDER — GABAPENTIN 100 MG PO CAPS: 100.0000 mg | ORAL_CAPSULE | Freq: Three times a day (TID) | ORAL | 1 refills | Status: DC

## 2022-07-05 NOTE — Patient Instructions (Addendum)
[facility]  Discharge Instructions  You were seen and examined today by Dr. Delton Coombes. Dr. Delton Coombes is a medical oncologist, meaning that he specializes in the treatment of cancer diagnoses. Dr. Delton Coombes discussed your past medical history, family history of cancers, and the events that led to you being here today.  You were referred to Dr. Delton Coombes due to an abnormal scan that revealed a possible chest wall mass.  Dr. Delton Coombes has recommended a PET scan. A PET scan is a specialized CT scan that illuminates where there is cancer present in the body.  For your pain, Dr. Delton Coombes has recommended Gabapentin 100mg  to be taken 3 times a day.  Follow-up as scheduled after the PET scan.  Thank you for choosing [facility] to provide your oncology and hematology care.   To afford each patient quality time with our provider, please arrive at least 15 minutes before your scheduled appointment time. You may need to reschedule your appointment if you arrive late (10 or more minutes). Arriving late affects you and other patients whose appointments are after yours.  Also, if you miss three or more appointments without notifying the office, you may be dismissed from the clinic at the provider's discretion.    Again, thank you for choosing [facility].  Our hope is that these requests will decrease the amount of time that you wait before being seen by our physicians.   If you have a lab appointment with the [facility] please come in thru the Main Entrance and check in at the main information desk.           _____________________________________________________________  Should you have questions after your visit to [facility], please contact our office at [phone] and follow the prompts.  Our office hours are 8:00 a.m. to 4:30 p.m. Monday - Thursday and 8:00 a.m. to 2:30 p.m. Friday.  Please note that  voicemails left after 4:00 p.m. may not be returned until the following business day.  We are closed weekends and all major holidays.  You do have access to a nurse 24-7, just call the main number to the clinic [phone] and do not press any options, hold on the line and a nurse will answer the phone.    For prescription refill requests, have your pharmacy contact our office and allow 72 hours.    Masks are optional in the cancer centers. If you would like for your care team to wear a mask while they are taking care of you, please let them know. You may have one support person who is at least 76 years old accompany you for your appointments.

## 2022-07-05 NOTE — Progress Notes (Signed)
[facility] NOTE  Patient Care Team: Glenda Chroman, MD as PCP - General (Internal Medicine) Derek Jack, MD as Medical Oncologist (Medical Oncology) Brien Mates, RN as Oncology Nurse Navigator (Medical Oncology)  CHIEF COMPLAINTS/PURPOSE OF CONSULTATION:  Right chest wall fluid collection/mass  HISTORY OF PRESENTING ILLNESS:  Megan Lozano 76 y.o. female is seen at the request of Dr.Vyas for further work-up and management of right chest wall pain in a patient with history of right lower lobe adenocarcinoma, which was treated with right lower lobectomy on 01/02/1995 in [place].  She reportedly had another surgery in 2004 after his CT scan on 04/19/2003 showed new nodule in the right lung apex.  Both times it was followed with radiation therapy.  I do not have any of these records to confirm.  She reported right anterior and lateral chest wall pain getting worse over the last 5 months.  She fell on her doorknob and her right posterior chest wall has been hurting since then.  She is taking oxycodone 5 mg only if the pain is very severe.  She only took 2 to 3 pills till now as she is afraid of getting hooked onto them.  A CT scan of the chest was done on 04/18/2022 which did not show any evidence of recurrence confidently.  There is fluid collection in the right lateral chest wall with extensive sclerosis and fragmentation.  No obvious lymphadenopathy or breast mass reported.  She lives at home with her son.  She denies any weight loss in the last 6 months.  MEDICAL HISTORY:  History reviewed. No pertinent past medical history.  SURGICAL HISTORY: History reviewed. No pertinent surgical history.  SOCIAL HISTORY: Social History   Socioeconomic History   Marital status: Widowed    Spouse name: Not on file   Number of children: Not on file   Years of education: Not on file   Highest education level: Not on file  Occupational History   Not on file   Tobacco Use   Smoking status: Former    Types: Cigarettes   Smokeless tobacco: Never  Vaping Use   Vaping Use: Never used  Substance and Sexual Activity   Alcohol use: Not Currently    Comment: glass of wine every evening   Drug use: Not Currently   Sexual activity: Not Currently  Other Topics Concern   Not on file  Social History Narrative   Not on file   Social Determinants of Health   Financial Resource Strain: Not on file  Food Insecurity: Not on file  Transportation Needs: Not on file  Physical Activity: Not on file  Stress: Not on file  Social Connections: Not on file  Intimate Partner Violence: Not on file    FAMILY HISTORY: Family History  Problem Relation Age of Onset   Breast cancer Sister    Ovarian cancer Sister    Liver disease Sister    Heart Problems Brother     ALLERGIES:  has no allergies on file.  MEDICATIONS:  Current Outpatient Medications  Medication Sig Dispense Refill   levothyroxine (SYNTHROID) 50 MCG tablet Take 50 mcg by mouth daily.     Lido-Menthol-Methyl Sal-Camph (CBD KINGS EX) Apply topically.     oxyCODONE (OXY IR/ROXICODONE) 5 MG immediate release tablet Take 5-10 mg by mouth every 4 (four) hours as needed.     rosuvastatin (CRESTOR) 10 MG tablet Take by mouth.     trolamine salicylate (ASPERCREME) 10 %  cream Apply 1 Application topically as needed for muscle pain.     gabapentin (NEURONTIN) 100 MG capsule Take 1 capsule (100 mg total) by mouth 3 (three) times daily. 90 capsule 1   No current facility-administered medications for this visit.    REVIEW OF SYSTEMS:   Constitutional: Denies fevers, chills or abnormal night sweats Eyes: Denies blurriness of vision, double vision or watery eyes Ears, nose, mouth, throat, and face: Denies mucositis or sore throat Respiratory: Denies cough, dyspnea or wheezes Cardiovascular: Occasional palpitations with no chest pain or leg swelling. Gastrointestinal:  Denies heartburn or change in  bowel habits.  Occasional nausea present. Skin: Denies abnormal skin rashes Lymphatics: Denies new lymphadenopathy or easy bruising Neurological:Denies numbness, tingling or new weaknesses Behavioral/Psych: Mood is stable, no new changes  All other systems were reviewed with the patient and are negative.  PHYSICAL EXAMINATION: ECOG PERFORMANCE STATUS: 1 - Symptomatic but completely ambulatory  Vitals:   07/05/22 1322  BP: (!) 152/93  Pulse: 94  Resp: 18  Temp: 97.6 F (36.4 C)  SpO2: 95%   Filed Weights   07/05/22 1322  Weight: 143 lb 1.3 oz (64.9 kg)    GENERAL:alert, no distress and comfortable SKIN: skin color, texture, turgor are normal, no rashes or significant lesions EYES: normal, conjunctiva are pink and non-injected, sclera clear OROPHARYNX:no exudate, no erythema and lips, buccal mucosa, and tongue normal  NECK: supple, thyroid normal size, non-tender, without nodularity LYMPH:  no palpable lymphadenopathy in the cervical, axillary or inguinal LUNGS: clear to auscultation and percussion with normal breathing effort.  There are tenderness at the right posterior ribs and right lateral ribs. HEART: regular rate & rhythm and no murmurs and no lower extremity edema ABDOMEN:abdomen soft, non-tender and normal bowel sounds Musculoskeletal:no cyanosis of digits and no clubbing  PSYCH: alert & oriented x 3 with fluent speech NEURO: no focal motor/sensory deficits  LABORATORY DATA:  I have reviewed the data as listed No results found for: "WBC", "HGB", "HCT", "MCV", "PLT"   Chemistry   No results found for: "NA", "K", "CL", "CO2", "BUN", "CREATININE", "GLU" No results found for: "CALCIUM", "ALKPHOS", "AST", "ALT", "BILITOT"     RADIOGRAPHIC STUDIES: I have personally reviewed the radiological images as listed and agreed with the findings in the report. No results found.  ASSESSMENT:  1.  Right chest wall mass/fluid collection: - Patient reported right anterior and  lateral chest wall pain getting worse for the past 5 months. - She was evaluated by Dr. Woody Seller. - CT chest (04/18/2022): Small loculated right pleural effusion.  Sizable rim calcified fluid collection in the lateral right chest wall with extensive sclerosis and fragmentation of the posterior lateral right ribs, predominantly involving the sixth through eighth ribs.  Differential diagnosis includes postoperative sequela, chronic infection or recurrent malignancy. - She reported that she fell on a door handle in the past couple of months and started having right posterior chest wall pain also.  There is a tender area in the right posterior lower rib. - She reports shooting type of pains in the right anterior chest wall and burning type of pain in the right lower anterior ribs.  Stabbing type of pain in the right back.  2.  Social/family history: - She lives at home with her son Dorothyann Peng) who is accompanying today.  She is independent of all her activities including IADLs and ADLs.  She worked as a Network engineer prior to retirement.  Non-smoker.  Smoked half pack per day for 20  years. - Sister died of breast cancer.  Another sister had ovarian cancer.  3.  Right lung adenocarcinoma: - 01/02/1995: Right thoracoscopy, pleural biopsy, right thoracotomy and right lower lobectomy followed by radiation therapy. - 04/19/2003-new nodule in the right apex. - Per patient this was removed and she had to undergo radiation.  This was done in [place].  I do not have any records of pathology.   PLAN:  1.  Right chest wall mass/fluid collection: - We have reviewed images of the CT scan with the patient and her son. - There is no clear evidence of malignancy.  However we will do a PET CT scan to rule out any local recurrence. - RTC after the PET scan.  2.  Right chest wall pain: - She reports shooting and burning type of pain in the right anterior chest wall and right upper quadrant.  Stabbing type of pain in the  right back. - She is currently taking oxycodone 5 mg only if she has very strong pain.  She took only 2 to 3 pills till now.  She is afraid of getting addicted. - We will start her on gabapentin 100 mg 3 times daily and titrate up as needed.   Orders Placed This Encounter  Procedures   NM PET Image Initial (PI) Skull Base To Thigh    Standing Status:   Future    Standing Expiration Date:   07/05/2023    Order Specific Question:   If indicated for the ordered procedure, I authorize the administration of a radiopharmaceutical per Radiology protocol    Answer:   Yes    Order Specific Question:   Preferred imaging location?    Answer:   [facility]    Order Specific Question:   Release to patient    Answer:   Immediate    All questions were answered. The patient knows to call the clinic with any problems, questions or concerns.      Derek Jack, MD 07/05/2022 4:34 PM

## 2022-07-12 ENCOUNTER — Encounter (HOSPITAL_COMMUNITY)
Admission: RE | Admit: 2022-07-12 | Discharge: 2022-07-12 | Disposition: A | Discharge status: Home / Self Care | Payer: PPO | Source: Ambulatory Visit | Attending: Hematology | Admitting: Hematology

## 2022-07-12 ENCOUNTER — Encounter (HOSPITAL_COMMUNITY): Payer: PPO

## 2022-07-12 DIAGNOSIS — C3490 Malignant neoplasm of unspecified part of unspecified bronchus or lung: Secondary | ICD-10-CM | POA: Insufficient documentation

## 2022-07-12 MED ORDER — FLUDEOXYGLUCOSE F - 18 (FDG) INJECTION
7.6800 | Freq: Once | INTRAVENOUS | Status: AC | PRN
  Administered 2022-07-12: 7.68 via INTRAVENOUS

## 2022-07-17 ENCOUNTER — Inpatient Hospital Stay: Payer: PPO | Admitting: Hematology

## 2022-08-01 ENCOUNTER — Inpatient Hospital Stay: Payer: PPO | Attending: Hematology | Admitting: Hematology

## 2022-08-01 VITALS — BP 146/90 | HR 90 | Temp 97.4°F | Resp 18 | Wt 143.2 lb

## 2022-08-01 DIAGNOSIS — Z803 Family history of malignant neoplasm of breast: Secondary | ICD-10-CM | POA: Insufficient documentation

## 2022-08-01 DIAGNOSIS — Z87891 Personal history of nicotine dependence: Secondary | ICD-10-CM | POA: Insufficient documentation

## 2022-08-01 DIAGNOSIS — Z79899 Other long term (current) drug therapy: Secondary | ICD-10-CM | POA: Insufficient documentation

## 2022-08-01 DIAGNOSIS — Z8041 Family history of malignant neoplasm of ovary: Secondary | ICD-10-CM | POA: Diagnosis not present

## 2022-08-01 DIAGNOSIS — Z85118 Personal history of other malignant neoplasm of bronchus and lung: Secondary | ICD-10-CM

## 2022-08-01 DIAGNOSIS — Z8379 Family history of other diseases of the digestive system: Secondary | ICD-10-CM | POA: Insufficient documentation

## 2022-08-01 DIAGNOSIS — Z8249 Family history of ischemic heart disease and other diseases of the circulatory system: Secondary | ICD-10-CM | POA: Insufficient documentation

## 2022-08-01 DIAGNOSIS — G479 Sleep disorder, unspecified: Secondary | ICD-10-CM | POA: Insufficient documentation

## 2022-08-01 DIAGNOSIS — R079 Chest pain, unspecified: Secondary | ICD-10-CM | POA: Insufficient documentation

## 2022-08-01 DIAGNOSIS — Z79890 Hormone replacement therapy (postmenopausal): Secondary | ICD-10-CM | POA: Insufficient documentation

## 2022-08-01 DIAGNOSIS — R0602 Shortness of breath: Secondary | ICD-10-CM | POA: Diagnosis not present

## 2022-08-01 DIAGNOSIS — R222 Localized swelling, mass and lump, trunk: Secondary | ICD-10-CM

## 2022-08-01 MED ORDER — OXYCODONE HCL 5 MG PO TABS: 5.0000 mg | ORAL_TABLET | Freq: Three times a day (TID) | ORAL | 0 refills | Status: DC | PRN

## 2022-08-01 MED ORDER — GABAPENTIN 100 MG PO CAPS: 200.0000 mg | ORAL_CAPSULE | Freq: Three times a day (TID) | ORAL | 1 refills | Status: DC

## 2022-08-01 NOTE — Progress Notes (Signed)
Megan Lozano, [place] [address]   CLINIC:  Medical Oncology/Hematology  PCP:  Glenda Chroman, MD [address] [place] [address] [phone]   REASON FOR VISIT:  Follow-up for right chest wall lesions  PRIOR THERAPY: Right lower lobectomy and radiation therapy  NGS Results: Not done  CURRENT THERAPY: Under work-up  BRIEF ONCOLOGIC HISTORY:  Oncology History   No history exists.    CANCER STAGING: Cancer Staging  No matching staging information was found for the patient.   INTERVAL HISTORY:  Megan Lozano 76 y.o. female seen for follow-up of right posterior chest wall lesion.  She reports that she has been taking oxycodone 5 mg twice daily with gabapentin 100 mg 3 times daily and Advil.  Pain is not fully controlled.  She reports energy levels of 25%.  She has difficulty sleeping due to pain.    REVIEW OF SYSTEMS:  Review of Systems  Respiratory:  Positive for shortness of breath.   Cardiovascular:  Positive for chest pain.  Psychiatric/Behavioral:  Positive for sleep disturbance.   All other systems reviewed and are negative.    PAST MEDICAL/SURGICAL HISTORY:  No past medical history on file. No past surgical history on file.   SOCIAL HISTORY:  Social History   Socioeconomic History   Marital status: Widowed    Spouse name: Not on file   Number of children: Not on file   Years of education: Not on file   Highest education level: Not on file  Occupational History   Not on file  Tobacco Use   Smoking status: Former    Types: Cigarettes   Smokeless tobacco: Never  Vaping Use   Vaping Use: Never used  Substance and Sexual Activity   Alcohol use: Not Currently    Comment: glass of wine every evening   Drug use: Not Currently   Sexual activity: Not Currently  Other Topics Concern   Not on file  Social History Narrative   Not on file   Social Determinants of Health   Financial Resource Strain: Not on file   Food Insecurity: Not on file  Transportation Needs: Not on file  Physical Activity: Not on file  Stress: Not on file  Social Connections: Not on file  Intimate Partner Violence: Not on file    FAMILY HISTORY:  Family History  Problem Relation Age of Onset   Breast cancer Sister    Ovarian cancer Sister    Liver disease Sister    Heart Problems Brother     CURRENT MEDICATIONS:  Outpatient Encounter Medications as of 08/01/2022  Medication Sig   AREXVY 120 MCG/0.5ML injection    FLUZONE HIGH-DOSE QUADRIVALENT 0.7 ML SUSY    levothyroxine (SYNTHROID) 50 MCG tablet Take 50 mcg by mouth daily.   Lido-Menthol-Methyl Sal-Camph (CBD KINGS EX) Apply topically.   rosuvastatin (CRESTOR) 10 MG tablet Take by mouth.   trolamine salicylate (ASPERCREME) 10 % cream Apply 1 Application topically as needed for muscle pain.   [DISCONTINUED] gabapentin (NEURONTIN) 100 MG capsule Take 1 capsule (100 mg total) by mouth 3 (three) times daily.   [DISCONTINUED] oxyCODONE (OXY IR/ROXICODONE) 5 MG immediate release tablet Take 5-10 mg by mouth every 4 (four) hours as needed.   gabapentin (NEURONTIN) 100 MG capsule Take 2 capsules (200 mg total) by mouth 3 (three) times daily.   oxyCODONE (OXY IR/ROXICODONE) 5 MG immediate release tablet Take 1 tablet (5 mg total) by mouth every 8 (eight)  hours as needed.   No facility-administered encounter medications on file as of 08/01/2022.    ALLERGIES:  Not on File   PHYSICAL EXAM:  ECOG Performance status: 1  Vitals:   08/01/22 1556  BP: (!) 146/90  Pulse: 90  Resp: 18  Temp: (!) 97.4 F (36.3 C)  SpO2: 96%   Filed Weights   08/01/22 1556  Weight: 143 lb 3.2 oz (65 kg)   Physical Exam Vitals reviewed.  Constitutional:      Appearance: Normal appearance.  Pulmonary:     Effort: Pulmonary effort is normal.     Breath sounds: Normal breath sounds.  Neurological:     Mental Status: She is alert.  Psychiatric:        Mood and Affect: Mood  normal.      LABORATORY DATA:  I have reviewed the labs as listed.  CBC No results found for: "WBC", "RBC", "HGB", "HCT", "PLT", "MCV", "MCH", "MCHC", "RDW", "LYMPHSABS", "MONOABS", "EOSABS", "BASOSABS"     No data to display          DIAGNOSTIC IMAGING:  I have independently reviewed the scans and discussed with the patient.  ASSESSMENT:  1.  Right chest wall mass/fluid collection: - Patient reported right anterior and lateral chest wall pain getting worse for the past 5 months. - She was evaluated by Dr. Woody Seller. - CT chest (04/18/2022): Small loculated right pleural effusion.  Sizable rim calcified fluid collection in the lateral right chest wall with extensive sclerosis and fragmentation of the posterior lateral right ribs, predominantly involving the sixth through eighth ribs.  Differential diagnosis includes postoperative sequela, chronic infection or recurrent malignancy. - She reported that she fell on a door handle in the past couple of months and started having right posterior chest wall pain also.  There is a tender area in the right posterior lower rib. - She reports shooting type of pains in the right anterior chest wall and burning type of pain in the right lower anterior ribs.  Stabbing type of pain in the right back. - PET/CT scan (07/12/2022): Destructive changes in the right posterolateral and lateral ribs with increased periosteal reaction and sunburst appearance.  Large cystic-appearing area in the right chest wall increased in size with mild to moderate metabolic activity.  Differential includes radiation-induced osteosarcoma, osteomyelitis or recurrent lung cancer.   2.  Social/family history: - She lives at home with her son Dorothyann Peng) who is accompanying today.  She is independent of all her activities including IADLs and ADLs.  She worked as a Network engineer prior to retirement.  Non-smoker.  Smoked half pack per day for 20 years. - Sister died of breast cancer.  Another  sister had ovarian cancer.   3.  Right lung adenocarcinoma: - 01/02/1995: Right thoracoscopy, pleural biopsy, right thoracotomy and right lower lobectomy followed by radiation therapy. - 04/19/2003-new nodule in the right apex. - Per patient this was removed and she had to undergo radiation.  This was done in [place].  I do not have any records of pathology.  PLAN:   1.  Right chest wall mass/fluid collection: - We reviewed images of the PET CT scan with her in detail. - Differential diagnosis includes radiation-induced osteosarcoma/osteomyelitis/recurrent lung cancer. - I have recommended biopsy of the hypermetabolic area. - Follow-up after biopsy.   2.  Right chest wall pain: - She has shooting/burning type of pain in the right anterior chest wall and right breast. - Will increase gabapentin to 200 mg  3 times daily.  She is taking oxycodone alternating with Advil alternating with gabapentin. - I have given a prescription for oxycodone 5 mg to be taken every 8 hours as needed.    Orders placed this encounter:  No orders of the defined types were placed in this encounter.     Derek Jack, MD [place] [phone]

## 2022-08-01 NOTE — Patient Instructions (Signed)
[place]  Discharge Instructions  You were seen and examined today by Dr. Delton Coombes.  Dr. Delton Coombes discussed your most recent lab work and CT scan which revealed that you have a spot on your right ribs that is concerning for cancer.  Dr. Delton Coombes recommends that you have a biopsy to see what it is for certain.   Follow-up as scheduled after biopsy.    Thank you for choosing [facility] to provide your oncology and hematology care.   To afford each patient quality time with our provider, please arrive at least 15 minutes before your scheduled appointment time. You may need to reschedule your appointment if you arrive late (10 or more minutes). Arriving late affects you and other patients whose appointments are after yours.  Also, if you miss three or more appointments without notifying the office, you may be dismissed from the clinic at the provider's discretion.    Again, thank you for choosing [facility].  Our hope is that these requests will decrease the amount of time that you wait before being seen by our physicians.   If you have a lab appointment with the [facility] please come in thru the Main Entrance and check in at the main information desk.           _____________________________________________________________  Should you have questions after your visit to [facility], please contact our office at [phone] and follow the prompts.  Our office hours are 8:00 a.m. to 4:30 p.m. Monday - Thursday and 8:00 a.m. to 2:30 p.m. Friday.  Please note that voicemails left after 4:00 p.m. may not be returned until the following business day.  We are closed weekends and all major holidays.  You do have access to a nurse 24-7, just call the main number to the clinic [phone] and do not press any options, hold on the line and a nurse will answer the phone.    For prescription refill requests,  have your pharmacy contact our office and allow 72 hours.    Masks are optional in the cancer centers. If you would like for your care team to wear a mask while they are taking care of you, please let them know. You may have one support person who is at least 76 years old accompany you for your appointments.

## 2022-08-02 NOTE — Progress Notes (Signed)
Mugweru, Wille Glaser, MD  [facility] D; [facility] Procedure Requests BIOPSY REVIEW Date: 08/02/22  Requested Biopsy site: R chest wall Reason for request: Mass  Imaging review: I reviewed all pertinent diagnostic studies, including; PET/CT 07/12/22 and CT Chest 04/18/22 Recommended imaging modality to perform biopsy: Ultrasound  Additional comments: R posterior chest wall mass with central necrosis. @Performing  Doc, HM cortical rim inferior to scapular tip may have higher diagnostic yield.  Please contact me with questions, concerns, or if issue pertaining to this request arise.  Michaelle Birks, MD [facility]   Pager. [place]

## 2022-08-02 NOTE — Addendum Note (Signed)
Addended by: Joie Bimler on: 08/02/2022 10:49 AM   Modules accepted: Orders

## 2022-08-15 ENCOUNTER — Other Ambulatory Visit: Payer: Self-pay | Admitting: Radiology

## 2022-08-15 DIAGNOSIS — R222 Localized swelling, mass and lump, trunk: Secondary | ICD-10-CM

## 2022-08-20 ENCOUNTER — Other Ambulatory Visit: Payer: Self-pay | Admitting: Student

## 2022-08-21 ENCOUNTER — Encounter (HOSPITAL_COMMUNITY): Payer: Self-pay

## 2022-08-21 ENCOUNTER — Other Ambulatory Visit: Payer: Self-pay

## 2022-08-21 ENCOUNTER — Other Ambulatory Visit: Payer: Self-pay | Admitting: Hematology

## 2022-08-21 ENCOUNTER — Ambulatory Visit (HOSPITAL_COMMUNITY)
Admission: RE | Admit: 2022-08-21 | Discharge: 2022-08-21 | Disposition: A | Discharge status: Home / Self Care | Payer: PPO | Source: Ambulatory Visit | Attending: Hematology | Admitting: Hematology

## 2022-08-21 DIAGNOSIS — C419 Malignant neoplasm of bone and articular cartilage, unspecified: Secondary | ICD-10-CM | POA: Diagnosis not present

## 2022-08-21 DIAGNOSIS — Z85118 Personal history of other malignant neoplasm of bronchus and lung: Secondary | ICD-10-CM

## 2022-08-21 DIAGNOSIS — Z87891 Personal history of nicotine dependence: Secondary | ICD-10-CM | POA: Insufficient documentation

## 2022-08-21 DIAGNOSIS — R222 Localized swelling, mass and lump, trunk: Secondary | ICD-10-CM

## 2022-08-21 DIAGNOSIS — Z902 Acquired absence of lung [part of]: Secondary | ICD-10-CM | POA: Insufficient documentation

## 2022-08-21 HISTORY — DX: Malignant neoplasm of unspecified part of unspecified bronchus or lung: C34.90

## 2022-08-21 LAB — PROTIME-INR
INR: 1.1 (ref 0.8–1.2)
Prothrombin Time: 14 seconds (ref 11.4–15.2)

## 2022-08-21 LAB — CBC
HCT: 45 % (ref 36.0–46.0)
Hemoglobin: 14.7 g/dL (ref 12.0–15.0)
MCH: 30.8 pg (ref 26.0–34.0)
MCHC: 32.7 g/dL (ref 30.0–36.0)
MCV: 94.3 fL (ref 80.0–100.0)
Platelets: 285 10*3/uL (ref 150–400)
RBC: 4.77 MIL/uL (ref 3.87–5.11)
RDW: 11.8 % (ref 11.5–15.5)
WBC: 6.6 10*3/uL (ref 4.0–10.5)
nRBC: 0 % (ref 0.0–0.2)

## 2022-08-21 MED ORDER — LIDOCAINE HCL 1 % IJ SOLN: 10.0000 mL | Freq: Once | INTRAMUSCULAR | Status: DC

## 2022-08-21 MED ORDER — SODIUM CHLORIDE 0.9 % IV SOLN: INTRAVENOUS | Status: DC

## 2022-08-21 MED ORDER — FENTANYL CITRATE (PF) 100 MCG/2ML IJ SOLN
INTRAMUSCULAR | Status: AC
  Filled 2022-08-21: qty 2

## 2022-08-21 MED ORDER — FENTANYL CITRATE (PF) 100 MCG/2ML IJ SOLN
INTRAMUSCULAR | Status: AC | PRN
  Administered 2022-08-21: 25 ug via INTRAVENOUS

## 2022-08-21 MED ORDER — MIDAZOLAM HCL 2 MG/2ML IJ SOLN
INTRAMUSCULAR | Status: AC
  Filled 2022-08-21: qty 2

## 2022-08-21 MED ORDER — MIDAZOLAM HCL 2 MG/2ML IJ SOLN
INTRAMUSCULAR | Status: AC | PRN
  Administered 2022-08-21: 1 mg via INTRAVENOUS

## 2022-08-21 NOTE — H&P (Signed)
Chief Complaint: Patient was seen in consultation today for right chest wall mass biopsy at the request of [place]  Referring Physician(s): [place]  Supervising Physician: Ruthann Cancer  Patient Status: [place]  History of Present Illness: Megan Lozano is a 76 y.o. female   Hx Lung cancer 1996 Partial lung removed 1996 Full lung removal 2004  Follows with Dr Delton Coombes  Pt states she has had low energy/fatigue for like 2 years Has had right chest wall pain 1 year--- worsening  CT 04/18/22:   MPRESSION: 1. Status post right pneumonectomy, with a small, loculated right pleural effusion. 2. Sizable rim calcified fluid collection subtending the lateral right chest wall, with extensive sclerosis and fragmentation of the posterolateral right ribs, predominantly involving the sixth through eighth ribs. Although this most likely reflects chronic postoperative sequela, chronic infection or recurrent malignancy is generally not excluded and comparison to prior imaging would be helpful to assess for stability over time. The appearance of the right chest is grossly similar to prior radiographs dating back to 07/18/2015.  PET 07/12/22: IMPRESSION: 1. Worsening of destructive changes about RIGHT posterolateral and lateral ribs with increased periosteal reaction and a sunburst appearance. This is associated with a large cystic appearing area in the RIGHT chest wall which is increased in size and shows mild to moderate metabolic activity. Findings may reflect chronic osteomyelitis. Findings do however raise the question of radiation induced osteosarcoma given the pattern of periosteal reaction about these ribs and the increase in the short interval of these changes. 2. Sampling of the cystic area may help to shed light on whether an infectious etiology should be considered over secondary neoplasm or recurrence of primary neoplasm. 3. Area of  increased metabolic activity near the anterior floor of the mouth just to the RIGHT of the midline. This may be due to odontogenic infection based on the appearance of mandibular incisors and periapical lucency but should be correlated with direct clinical inspection to ensure no neoplasm at the floor of the mouth. 4. LEFT adrenal adenoma for which no dedicated follow-up is recommended. Based on current clinical literature, biochemical evaluation to exclude possible functioning adrenal nodule is suggested if not already performed. Please refer to current clinical guidelines for detailed recommendations. NEJM 4496:759 154-51 5. L1 compression fracture with similar appearance. 6. Aortic atherosclerosis. 7. Signs of RIGHT pneumonectomy as before.   Scheduled today for right chest wall mass biopsy    Past Medical History:  Diagnosis Date   Lung cancer ([place])     History reviewed. No pertinent surgical history.  Allergies: Patient has no allergy information on record.  Medications: Prior to Admission medications   Medication Sig Start Date End Date Taking? Authorizing Provider  gabapentin (NEURONTIN) 100 MG capsule Take 2 capsules (200 mg total) by mouth 3 (three) times daily. 08/01/22  Yes Derek Jack, MD  levothyroxine (SYNTHROID) 50 MCG tablet Take 50 mcg by mouth daily. 01/08/22  Yes [provider]  Lido-Menthol-Methyl Sal-Camph (CBD KINGS EX) Apply topically.   Yes [provider]  oxyCODONE (OXY IR/ROXICODONE) 5 MG immediate release tablet Take 1 tablet (5 mg total) by mouth every 8 (eight) hours as needed. 08/01/22  Yes Derek Jack, MD  rosuvastatin (CRESTOR) 10 MG tablet Take by mouth. 01/08/22  Yes [provider]  trolamine salicylate (ASPERCREME) 10 % cream Apply 1 Application topically as needed for muscle pain.   Yes [provider]  AREXVY 120 MCG/0.5ML injection  06/09/22   [provider]  FLUZONE HIGH-DOSE  QUADRIVALENT 0.7 ML SUSY  06/09/22   [provider]     Family History  Problem Relation Age of Onset   Breast cancer Sister    Ovarian cancer Sister    Liver disease Sister    Heart Problems Brother     Social History   Socioeconomic History   Marital status: Widowed    Spouse name: Not on file   Number of children: Not on file   Years of education: Not on file   Highest education level: Not on file  Occupational History   Not on file  Tobacco Use   Smoking status: Former    Types: Cigarettes   Smokeless tobacco: Never  Vaping Use   Vaping Use: Never used  Substance and Sexual Activity   Alcohol use: Not Currently    Comment: glass of wine every evening   Drug use: Not Currently   Sexual activity: Not Currently  Other Topics Concern   Not on file  Social History Narrative   Not on file   Social Determinants of Health   Financial Resource Strain: Not on file  Food Insecurity: Not on file  Transportation Needs: Not on file  Physical Activity: Not on file  Stress: Not on file  Social Connections: Not on file    Review of Systems: A 12 point ROS discussed and pertinent positives are indicated in the HPI above.  All other systems are negative.  Review of Systems  Constitutional:  Positive for activity change, appetite change and fatigue. Negative for fever.  Respiratory:  Negative for cough and shortness of breath.   Cardiovascular:  Positive for chest pain.  Gastrointestinal:  Negative for abdominal pain.  Musculoskeletal:  Positive for back pain.  Psychiatric/Behavioral:  Negative for behavioral problems and confusion.     Vital Signs: BP (!) 150/84   Pulse (!) 104   Temp 97.6 F (36.4 C) (Temporal)   Resp 18   Ht 5\' 4"  (1.626 m)   Wt 140 lb (63.5 kg)   LMP 07/28/2015   SpO2 91%   BMI 24.03 kg/m    Physical Exam Vitals reviewed.  HENT:     Mouth/Throat:     Mouth: Mucous membranes are moist.  Cardiovascular:     Rate and Rhythm:  Normal rate and regular rhythm.     Heart sounds: Normal heart sounds.  Pulmonary:     Effort: Pulmonary effort is normal.     Breath sounds: Normal breath sounds.  Abdominal:     Palpations: Abdomen is soft.  Musculoskeletal:        General: Normal range of motion.  Skin:    General: Skin is warm.  Neurological:     Mental Status: She is alert and oriented to person, place, and time.  Psychiatric:        Behavior: Behavior normal.     Imaging: No results found.  Labs:  CBC: Recent Labs    08/21/22 1130  WBC 6.6  HGB 14.7  HCT 45.0  PLT 285    COAGS: Recent Labs    08/21/22 1130  INR 1.1    BMP: No results for input(s): "NA", "K", "CL", "CO2", "GLUCOSE", "BUN", "CALCIUM", "CREATININE", "GFRNONAA", "GFRAA" in the last 8760 hours.  Invalid input(s): "CMP"  LIVER FUNCTION TESTS: No results for input(s): "BILITOT", "AST", "ALT", "ALKPHOS", "PROT", "ALBUMIN" in the last 8760 hours.  TUMOR MARKERS: No results for input(s): "AFPTM", "CEA", "CA199", "CHROMGRNA" in the last 8760  hours.  Assessment and Plan:  Known Lung cancer since 1996--- Rt lung removal since 2004 Right chest wall pain x 1 yr--- worsening CT and PET revealing right chest wall mass Now for biopsy today Risks and benefits of right chest wall mass biopsy was discussed with the patient and/or patient's family including, but not limited to bleeding, infection, damage to adjacent structures or low yield requiring additional tests.  All of the questions were answered and there is agreement to proceed.  Consent signed and in chart.  Thank you for this interesting consult.  I greatly enjoyed meeting Megan Lozano and look forward to participating in their care.  A copy of this report was sent to the requesting provider on this date.  Electronically Signed: Lavonia Drafts, PA-C 08/21/2022, 12:16 PM   I spent a total of  30 Minutes   in face to face in clinical consultation, greater than 50%  of which was counseling/coordinating care for right chest wall mass biopsy

## 2022-08-21 NOTE — Procedures (Signed)
Interventional Radiology Procedure Note  Procedure: CT guided right chest wall mass biopsy  Indication: Lung ca with new chest wall mass  Findings: Please refer to procedural dictation for full description.  Complications: None  EBL: < 10 mL  Miachel Roux, MD [phone]

## 2022-08-27 ENCOUNTER — Inpatient Hospital Stay: Payer: PPO | Admitting: Hematology

## 2022-08-29 ENCOUNTER — Ambulatory Visit: Payer: PPO | Admitting: Hematology

## 2022-09-03 LAB — SURGICAL PATHOLOGY

## 2022-09-04 ENCOUNTER — Encounter (HOSPITAL_COMMUNITY): Payer: Self-pay

## 2022-09-05 ENCOUNTER — Other Ambulatory Visit: Payer: Self-pay | Admitting: *Deleted

## 2022-09-05 ENCOUNTER — Encounter: Payer: Self-pay | Admitting: Hematology

## 2022-09-05 ENCOUNTER — Inpatient Hospital Stay: Payer: PPO | Attending: Hematology | Admitting: Hematology

## 2022-09-05 VITALS — BP 167/91 | HR 99 | Temp 98.6°F | Resp 16 | Wt 139.2 lb

## 2022-09-05 DIAGNOSIS — Z87891 Personal history of nicotine dependence: Secondary | ICD-10-CM | POA: Diagnosis not present

## 2022-09-05 DIAGNOSIS — Z8249 Family history of ischemic heart disease and other diseases of the circulatory system: Secondary | ICD-10-CM | POA: Diagnosis not present

## 2022-09-05 DIAGNOSIS — Z803 Family history of malignant neoplasm of breast: Secondary | ICD-10-CM | POA: Diagnosis not present

## 2022-09-05 DIAGNOSIS — Z8041 Family history of malignant neoplasm of ovary: Secondary | ICD-10-CM | POA: Insufficient documentation

## 2022-09-05 DIAGNOSIS — Z85118 Personal history of other malignant neoplasm of bronchus and lung: Secondary | ICD-10-CM | POA: Insufficient documentation

## 2022-09-05 DIAGNOSIS — Z79899 Other long term (current) drug therapy: Secondary | ICD-10-CM | POA: Diagnosis not present

## 2022-09-05 DIAGNOSIS — C419 Malignant neoplasm of bone and articular cartilage, unspecified: Secondary | ICD-10-CM | POA: Insufficient documentation

## 2022-09-05 DIAGNOSIS — Z79890 Hormone replacement therapy (postmenopausal): Secondary | ICD-10-CM | POA: Insufficient documentation

## 2022-09-05 DIAGNOSIS — Z8379 Family history of other diseases of the digestive system: Secondary | ICD-10-CM | POA: Diagnosis not present

## 2022-09-05 MED ORDER — OXYCODONE HCL 5 MG PO TABS: 5.0000 mg | ORAL_TABLET | Freq: Three times a day (TID) | ORAL | 0 refills | Status: DC | PRN

## 2022-09-05 MED ORDER — GABAPENTIN 100 MG PO CAPS: 200.0000 mg | ORAL_CAPSULE | Freq: Three times a day (TID) | ORAL | 1 refills | Status: AC

## 2022-09-05 MED ORDER — DEXAMETHASONE 2 MG PO TABS: 2.0000 mg | ORAL_TABLET | Freq: Every day | ORAL | 3 refills | Status: AC

## 2022-09-05 NOTE — Patient Instructions (Addendum)
[place] at [facility] Discharge Instructions   You were seen and examined today by Dr. Delton Coombes.  He reviewed the results of your biopsy which is showing osteosarcoma. You will need to see a specialist either [facility] or [facility]. They may want you to have treatment (chemo) prior to surgery, with surgery being the only cure. We will have you see the specialist first prior to making any treatment decisions.   The other option if you decide not to pursue treatment is to enroll in palliative care or hospice.   You have decided you would not like to pursue treatment or surgery. We will make you a referral to palliative care for management of your pain and to periodically assess your needs.  We sent a prescription for dexamethasone for you to take daily in the morning to help with pain and give you energy.   We sent refills for gabapentin and oxycodone prescriptions.    Thank you for choosing [place] at [facility] to provide your oncology and hematology care.  To afford each patient quality time with our provider, please arrive at least 15 minutes before your scheduled appointment time.   If you have a lab appointment with the [facility] please come in thru the Main Entrance and check in at the main information desk.  You need to re-schedule your appointment should you arrive 10 or more minutes late.  We strive to give you quality time with our providers, and arriving late affects you and other patients whose appointments are after yours.  Also, if you no show three or more times for appointments you may be dismissed from the clinic at the providers discretion.     Again, thank you for choosing [facility].  Our hope is that these requests will decrease the amount of time that you wait before being seen by our physicians.       _____________________________________________________________  Should you have questions after your  visit to [facility], please contact our office at [phone] and follow the prompts.  Our office hours are 8:00 a.m. and 4:30 p.m. Monday - Friday.  Please note that voicemails left after 4:00 p.m. may not be returned until the following business day.  We are closed weekends and major holidays.  You do have access to a nurse 24-7, just call the main number to the clinic [phone] and do not press any options, hold on the line and a nurse will answer the phone.    For prescription refill requests, have your pharmacy contact our office and allow 72 hours.    Due to Covid, you will need to wear a mask upon entering the hospital. If you do not have a mask, a mask will be given to you at the Main Entrance upon arrival. For doctor visits, patients may have 1 support person age 42 or older with them. For treatment visits, patients can not have anyone with them due to social distancing guidelines and our immunocompromised population.

## 2022-09-05 NOTE — Progress Notes (Signed)
Megan Lozano, [place] [address]   CLINIC:  Medical Oncology/Hematology  PCP:  Megan Chroman, MD [address] [place] [id number]   REASON FOR VISIT:  Follow-up for right chest wall lesions  PRIOR THERAPY: Right lower lobectomy and radiation therapy  NGS Results: Not done  CURRENT THERAPY: Under work-up  BRIEF ONCOLOGIC HISTORY:  Oncology History   No history exists.    CANCER STAGING:  Cancer Staging  Osteosarcoma [facility]) Staging form: Bone - Appendicular Skeleton, Trunk, Skull, and Facial Bones, AJCC 8th Edition - Clinical stage from 09/05/2022: Stage III (cT3, cN0, cM0, G3) - Unsigned    INTERVAL HISTORY:  Megan Lozano 76 y.o. 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  She is taking oxycodone 5 mg twice daily and gabapentin and ibuprofen in between.  She reports energy levels 50%.  She is able to do day-to-day activities.  She reports pain on the right breast region about 4 out of 10.    REVIEW OF SYSTEMS:  Review of Systems  All other systems reviewed and are negative.    PAST MEDICAL/SURGICAL HISTORY:  Past Medical History:  Diagnosis Date   Lung cancer [facility])    History reviewed. No pertinent surgical history.   SOCIAL HISTORY:  Social History   Socioeconomic History   Marital status: Widowed    Spouse name: Not on file   Number of children: Not on file   Years of education: Not on file   Highest education level: Not on file  Occupational History   Not on file  Tobacco Use   Smoking status: Former    Types: Cigarettes   Smokeless tobacco: Never  Vaping Use   Vaping Use: Never used  Substance and Sexual Activity   Alcohol use: Not Currently    Comment: glass of wine every evening   Drug use: Not Currently   Sexual activity: Not Currently  Other Topics Concern   Not on file  Social History Narrative   Not on file   Social Determinants of  Health   Financial Resource Strain: Not on file  Food Insecurity: Not on file  Transportation Needs: Not on file  Physical Activity: Not on file  Stress: Not on file  Social Connections: Not on file  Intimate Partner Violence: Not on file    FAMILY HISTORY:  Family History  Problem Relation Age of Onset   Breast cancer Sister    Ovarian cancer Sister    Liver disease Sister    Heart Problems Brother     CURRENT MEDICATIONS:  Outpatient Encounter Medications as of 09/05/2022  Medication Sig   AREXVY 120 MCG/0.5ML injection    dexamethasone (DECADRON) 2 MG tablet Take 1 tablet (2 mg total) by mouth daily with breakfast.   FLUZONE HIGH-DOSE QUADRIVALENT 0.7 ML SUSY    levothyroxine (SYNTHROID) 50 MCG tablet Take 50 mcg by mouth daily.   Lido-Menthol-Methyl Sal-Camph (CBD KINGS EX) Apply topically.   rosuvastatin (CRESTOR) 10 MG tablet Take by mouth.   trolamine salicylate (ASPERCREME) 10 % cream Apply 1 Application topically as needed for muscle pain.   [DISCONTINUED] gabapentin (NEURONTIN) 100 MG capsule Take 2 capsules (200 mg total) by mouth 3 (three) times daily.   [DISCONTINUED] oxyCODONE (OXY IR/ROXICODONE) 5 MG immediate release tablet Take 1 tablet (5 mg total) by mouth every 8 (eight) hours as needed.   gabapentin (NEURONTIN) 100 MG capsule Take 2  capsules (200 mg total) by mouth 3 (three) times daily.   No facility-administered encounter medications on file as of 09/05/2022.    ALLERGIES:  No Known Allergies   PHYSICAL EXAM:  ECOG Performance status: 1  Vitals:   09/05/22 1506  BP: (!) 167/91  Pulse: 99  Resp: 16  Temp: 98.6 F (37 C)  SpO2: 96%   Filed Weights   09/05/22 1506  Weight: 139 lb 3.2 oz (63.1 kg)   Physical Exam Vitals reviewed.  Constitutional:      Appearance: Normal appearance.  Pulmonary:     Effort: Pulmonary effort is normal.     Breath sounds: Normal breath sounds.  Neurological:     Mental Status: She is alert.   Psychiatric:        Mood and Affect: Mood normal.      LABORATORY DATA:  I have reviewed the labs as listed.  CBC    Component Value Date/Time   WBC 6.6 08/21/2022 1130   RBC 4.77 08/21/2022 1130   HGB 14.7 08/21/2022 1130   HCT 45.0 08/21/2022 1130   PLT 285 08/21/2022 1130   MCV 94.3 08/21/2022 1130   MCH 30.8 08/21/2022 1130   MCHC 32.7 08/21/2022 1130   RDW 11.8 08/21/2022 1130       No data to display          DIAGNOSTIC IMAGING:  I have independently reviewed the scans and discussed with the patient.  ASSESSMENT:  1.  Right chest wall mass/fluid collection: - Patient reported right anterior and lateral chest wall pain getting worse for the past 5 months. - She was evaluated by Dr. Woody Seller. - CT chest (04/18/2022): Small loculated right pleural effusion.  Sizable rim calcified fluid collection in the lateral right chest wall with extensive sclerosis and fragmentation of the posterior lateral right ribs, predominantly involving the sixth through eighth ribs.  Differential diagnosis includes postoperative sequela, chronic infection or recurrent malignancy. - She reported that she fell on a door handle in the past couple of months and started having right posterior chest wall pain also.  There is a tender area in the right posterior lower rib. - She reports shooting type of pains in the right anterior chest wall and burning type of pain in the right lower anterior ribs.  Stabbing type of pain in the right back. - PET/CT scan (07/12/2022): Destructive changes in the right posterolateral and lateral ribs with increased periosteal reaction and sunburst appearance.  Large cystic-appearing area in the right chest wall increased in size with mild to moderate metabolic activity.  Differential includes radiation-induced osteosarcoma, osteomyelitis or recurrent lung cancer.   2.  Social/family history: - She lives at home with her son Megan Lozano) who is accompanying today.  She is  independent of all her activities including IADLs and ADLs.  She worked as a Network engineer prior to retirement.  Non-smoker.  Smoked half pack per day for 20 years. - Sister died of breast cancer.  Another sister had ovarian cancer.   3.  Right lung adenocarcinoma: - 01/02/1995: Right thoracoscopy, pleural biopsy, right thoracotomy and right lower lobectomy followed by radiation therapy. - 04/19/2003-new nodule in the right apex. - Per patient this was removed and she had to undergo radiation.  This was done in [place].  I do not have any records of pathology.  PLAN:   1.  Right chest wall osteosarcoma: - I have reviewed images of the PET scan with her in detail. - We  reviewed surgical pathology from 08/21/2022 of the right chest wall mass biopsy.  This is consistent with high-grade osteosarcoma likely radiation associated. - We talked about her new diagnosis in detail. - I have recommended referral to tertiary center for multidisciplinary evaluation and treatment options involving possible chemotherapy and surgery if she is a candidate. - After discussion, she told me that she does not want to pursue any aggressive options. - We talked about pain control with palliative care and transition to hospice should her condition decline.  She is agreeable. - She would like to come back to as in a couple of months to assess her situation.  If she changes her mind, she will call us for the referral to a tertiary center.   2.  Right chest wall pain: - She has shooting/burning type of pain in the right anterior chest wall and right breast. - Continue gabapentin 200 mg 3 times daily.  She will also alternate with Advil. - Continue oxycodone 5 mg twice daily as needed. - Will also start her on dexamethasone 2 mg in the mornings. - We will make a referral to palliative therapy for better pain control.    Orders placed this encounter:  No orders of the defined types were placed in this  encounter.     Derek Jack, MD [place] [phone]

## 2022-09-13 ENCOUNTER — Encounter (HOSPITAL_COMMUNITY): Payer: Self-pay

## 2022-09-18 NOTE — Progress Notes (Signed)
Pre procedure protocol 

## 2022-09-20 DIAGNOSIS — Z515 Encounter for palliative care: Secondary | ICD-10-CM | POA: Diagnosis not present

## 2022-09-20 DIAGNOSIS — C413 Malignant neoplasm of ribs, sternum and clavicle: Secondary | ICD-10-CM | POA: Diagnosis not present

## 2022-09-20 DIAGNOSIS — C7989 Secondary malignant neoplasm of other specified sites: Secondary | ICD-10-CM | POA: Diagnosis not present

## 2022-09-28 ENCOUNTER — Other Ambulatory Visit: Payer: Self-pay | Admitting: Hematology

## 2022-11-06 ENCOUNTER — Other Ambulatory Visit: Payer: PPO

## 2022-11-06 ENCOUNTER — Ambulatory Visit: Payer: PPO | Admitting: Hematology

## 2023-10-27 NOTE — Progress Notes (Signed)
[facility] [address], [place] [address]    Clinic Day:  10/28/2023  Referring physician: Ignatius Specking, MD  Patient Care Team: Ignatius Specking, MD as PCP - General (Internal Medicine) Doreatha Massed, MD as Medical Oncologist (Medical Oncology) Therese Sarah, RN as Oncology Nurse Navigator (Medical Oncology)   ASSESSMENT & PLAN:   Assessment: 1.  Right chest wall mass/fluid collection: - Patient reported right anterior and lateral chest wall pain getting worse for the past 5 months. - She was evaluated by Dr. Sherril Croon. - CT chest (04/18/2022): Small loculated right pleural effusion.  Sizable rim calcified fluid collection in the lateral right chest wall with extensive sclerosis and fragmentation of the posterior lateral right ribs, predominantly involving the sixth through eighth ribs.  Differential diagnosis includes postoperative sequela, chronic infection or recurrent malignancy. - She reported that she fell on a door handle in the past couple of months and started having right posterior chest wall pain also.  There is a tender area in the right posterior lower rib. - She reports shooting type of pains in the right anterior chest wall and burning type of pain in the right lower anterior ribs.  Stabbing type of pain in the right back. - PET/CT scan (07/12/2022): Destructive changes in the right posterolateral and lateral ribs with increased periosteal reaction and sunburst appearance.  Large cystic-appearing area in the right chest wall increased in size with mild to moderate metabolic activity.  Differential includes radiation-induced osteosarcoma, osteomyelitis or recurrent lung cancer.   2.  Social/family history: - She lives at home with her son Duffy Rhody) who is accompanying today.  She is independent of all her activities including IADLs and ADLs.  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  Non-smoker.  Smoked half pack per day for 20 years. - Sister  died of breast cancer.  Another sister had ovarian cancer.   3.  Right lung adenocarcinoma: - 01/02/1995: Right thoracoscopy, pleural biopsy, right thoracotomy and right lower lobectomy followed by radiation therapy. - 04/19/2003-new nodule in the right apex. - Per patient this was removed and she had to undergo radiation.  This was done in [place].  I do not have any records of pathology.    Plan: 1.  Right chest wall osteosarcoma: - She has declined any active therapy in the past. - She had 40 pound weight loss in the last 1 year.  She reported increased growth of the tumor involving right posterior chest wall, right anterior chest wall into the right breast, more pronounced in the last 2 months. - Physical exam: Mass palpable in the right posterior upper chest wall and right breast area. - Continue palliative care with progression to hospice.   2.  Right chest wall pain: - Continue gabapentin 300 mg 3 times daily, fentanyl patch 125 mcg daily and oxycodone 20 mg every 4 hours as needed.  She is enrolled with [facility].    No orders of the defined types were placed in this encounter.     I,Katie Daubenspeck,acting as a Neurosurgeon for Doreatha Massed, MD.,have documented all relevant documentation on the behalf of Doreatha Massed, MD,as directed by  Doreatha Massed, MD while in the presence of Doreatha Massed, MD.   I, Doreatha Massed MD, have reviewed the above documentation for accuracy and completeness, and I agree with the above.   Doreatha Massed, MD   2/3/202510:52 AM  CHIEF COMPLAINT:   Diagnosis: osteosarcoma of right posterior  chest wall   Cancer Staging  Osteosarcoma [facility]) Staging form: Bone - Appendicular Skeleton, Trunk, Skull, and Facial Bones, AJCC 8th Edition - Clinical stage from 09/05/2022: Stage III (cT3, cN0, cM0, G3) - Unsigned    Prior Therapy: none  Current Therapy:  observation   HISTORY OF PRESENT ILLNESS:   Oncology  History   No history exists.     INTERVAL HISTORY:   Nolan is a 78 y.o. female presenting to clinic today for follow up of osteosarcoma of right posterior chest wall. She was last seen by me on 09/05/22.  Today, she states that she is doing well overall. Her appetite level is at 10%. Her energy level is at 10%.  PAST MEDICAL HISTORY:   Past Medical History: Past Medical History:  Diagnosis Date   Lung cancer [facility])     Surgical History: No past surgical history on file.  Social History: Social History   Socioeconomic History   Marital status: Widowed    Spouse name: Not on file   Number of children: Not on file   Years of education: Not on file   Highest education level: Not on file  Occupational History   Not on file  Tobacco Use   Smoking status: Former    Types: Cigarettes   Smokeless tobacco: Never  Vaping Use   Vaping status: Never Used  Substance and Sexual Activity   Alcohol use: Not Currently    Comment: glass of wine every evening   Drug use: Not Currently   Sexual activity: Not Currently  Other Topics Concern   Not on file  Social History Narrative   Not on file   Social Drivers of Health   Financial Resource Strain: Not on file  Food Insecurity: Not on file  Transportation Needs: Not on file  Physical Activity: Not on file  Stress: Not on file  Social Connections: Not on file  Intimate Partner Violence: Not on file    Family History: Family History  Problem Relation Age of Onset   Breast cancer Sister    Ovarian cancer Sister    Liver disease Sister    Heart Problems Brother     Current Medications:  Current Outpatient Medications:    AREXVY 120 MCG/0.5ML injection, , Disp: , Rfl:    dexamethasone (DECADRON) 2 MG tablet, Take 1 tablet (2 mg total) by mouth daily with breakfast., Disp: 30 tablet, Rfl: 3   fentaNYL (DURAGESIC) 100 MCG/HR, 1 patch every 3 (three) days., Disp: , Rfl:    fentaNYL (DURAGESIC) 25 MCG/HR, 1 patch every 3  (three) days., Disp: , Rfl:    FLUZONE HIGH-DOSE QUADRIVALENT 0.7 ML SUSY, , Disp: , Rfl:    gabapentin (NEURONTIN) 100 MG capsule, Take 2 capsules (200 mg total) by mouth 3 (three) times daily., Disp: 180 capsule, Rfl: 1   GOODSENSE CLEARLAX 17 GM/SCOOP powder, SMARTSIG:17 Gram(s) By Mouth Daily PRN, Disp: , Rfl:    levothyroxine (SYNTHROID) 50 MCG tablet, Take 50 mcg by mouth daily., Disp: , Rfl:    Lido-Menthol-Methyl Sal-Camph (CBD KINGS EX), Apply topically., Disp: , Rfl:    omeprazole (PRILOSEC) 40 MG capsule, Take 40 mg by mouth daily., Disp: , Rfl:    ondansetron (ZOFRAN) 4 MG tablet, Take 4 mg by mouth every 4 (four) hours as needed., Disp: , Rfl:    oxyCODONE (OXY IR/ROXICODONE) 5 MG immediate release tablet, TAKE 1 TABLET BY MOUTH EVERY 8 HOURS AS NEEDED., Disp: 60 tablet, Rfl: 0   Oxycodone HCl  20 MG TABS, Take 1 tablet by mouth every 4 (four) hours as needed., Disp: , Rfl:    prochlorperazine (COMPAZINE) 5 MG tablet, Take 5 mg by mouth every 6 (six) hours as needed., Disp: , Rfl:    rosuvastatin (CRESTOR) 10 MG tablet, Take by mouth., Disp: , Rfl:    SENNA-PLUS 8.6-50 MG tablet, Take 1 tablet by mouth 3 (three) times daily as needed., Disp: , Rfl:    sodium phosphate (FLEET) ENEM, SMARTSIG:Rectally Daily PRN, Disp: , Rfl:    trolamine salicylate (ASPERCREME) 10 % cream, Apply 1 Application topically as needed for muscle pain., Disp: , Rfl:    Allergies: No Known Allergies  REVIEW OF SYSTEMS:   Review of Systems  Constitutional:  Negative for chills, fatigue and fever.  HENT:   Negative for lump/mass, mouth sores, nosebleeds, sore throat and trouble swallowing.   Eyes:  Negative for eye problems.  Respiratory:  Negative for cough and shortness of breath.   Cardiovascular:  Positive for chest pain. Negative for leg swelling and palpitations.  Gastrointestinal:  Negative for abdominal pain, constipation, diarrhea, nausea and vomiting.  Genitourinary:  Negative for bladder  incontinence, difficulty urinating, dysuria, frequency, hematuria and nocturia.   Musculoskeletal:  Negative for arthralgias, back pain, flank pain, myalgias and neck pain.  Skin:  Negative for itching and rash.  Neurological:  Negative for dizziness, headaches and numbness.  Hematological:  Does not bruise/bleed easily.  Psychiatric/Behavioral:  Negative for depression, sleep disturbance and suicidal ideas. The patient is not nervous/anxious.   All other systems reviewed and are negative.    VITALS:   Blood pressure 101/68, pulse (!) 113, temperature 98.7 F (37.1 C), temperature source Tympanic, resp. rate 18, weight 99 lb 10.4 oz (45.2 kg), last menstrual period 07/28/2015, SpO2 93%.  Wt Readings from Last 3 Encounters:  10/28/23 99 lb 10.4 oz (45.2 kg)  09/05/22 139 lb 3.2 oz (63.1 kg)  08/27/22 140 lb 12.8 oz (63.9 kg)    Body mass index is 17.1 kg/m.  Performance status (ECOG): 2 - Symptomatic, <50% confined to bed  PHYSICAL EXAM:   Physical Exam Vitals and nursing note reviewed. Exam conducted with a chaperone present.  Constitutional:      Appearance: Normal appearance.  Cardiovascular:     Rate and Rhythm: Normal rate and regular rhythm.     Pulses: Normal pulses.     Heart sounds: Normal heart sounds.  Pulmonary:     Effort: Pulmonary effort is normal.     Breath sounds: Normal breath sounds.  Abdominal:     Palpations: Abdomen is soft. There is no hepatomegaly, splenomegaly or mass.     Tenderness: There is no abdominal tenderness.  Musculoskeletal:     Right lower leg: No edema.     Left lower leg: No edema.  Lymphadenopathy:     Cervical: No cervical adenopathy.     Right cervical: No superficial, deep or posterior cervical adenopathy.    Left cervical: No superficial, deep or posterior cervical adenopathy.     Upper Body:     Right upper body: No supraclavicular or axillary adenopathy.     Left upper body: No supraclavicular or axillary adenopathy.   Neurological:     General: No focal deficit present.     Mental Status: She is alert and oriented to person, place, and time.  Psychiatric:        Mood and Affect: Mood normal.        Behavior: Behavior normal.  LABS:   CBC     Component Value Date/Time   WBC 6.6 08/21/2022 1130   RBC 4.77 08/21/2022 1130   HGB 14.7 08/21/2022 1130   HCT 45.0 08/21/2022 1130   PLT 285 08/21/2022 1130   MCV 94.3 08/21/2022 1130   MCH 30.8 08/21/2022 1130   MCHC 32.7 08/21/2022 1130   RDW 11.8 08/21/2022 1130    CMP  No results found for: "NA", "K", "CL", "CO2", "GLUCOSE", "BUN", "CREATININE", "CALCIUM", "PROT", "ALBUMIN", "AST", "ALT", "ALKPHOS", "BILITOT", "GFRNONAA", "GFRAA"   No results found for: "CEA1", "CEA" / No results found for: "CEA1", "CEA" No results found for: "PSA1" No results found for: "KGM010" No results found for: "CAN125"  No results found for: "TOTALPROTELP", "ALBUMINELP", "A1GS", "A2GS", "BETS", "BETA2SER", "GAMS", "MSPIKE", "SPEI" No results found for: "TIBC", "FERRITIN", "IRONPCTSAT" No results found for: "LDH"   STUDIES:   No results found.

## 2023-10-28 ENCOUNTER — Inpatient Hospital Stay: Payer: PPO | Attending: Hematology | Admitting: Hematology

## 2023-10-28 VITALS — BP 101/68 | HR 113 | Temp 98.7°F | Resp 18 | Wt 99.6 lb

## 2023-10-28 DIAGNOSIS — N6310 Unspecified lump in the right breast, unspecified quadrant: Secondary | ICD-10-CM | POA: Diagnosis not present

## 2023-10-28 DIAGNOSIS — R0789 Other chest pain: Secondary | ICD-10-CM | POA: Insufficient documentation

## 2023-10-28 DIAGNOSIS — R222 Localized swelling, mass and lump, trunk: Secondary | ICD-10-CM | POA: Diagnosis present

## 2023-10-28 DIAGNOSIS — Z79899 Other long term (current) drug therapy: Secondary | ICD-10-CM | POA: Insufficient documentation

## 2023-10-28 DIAGNOSIS — Z8041 Family history of malignant neoplasm of ovary: Secondary | ICD-10-CM | POA: Diagnosis not present

## 2023-10-28 DIAGNOSIS — Z85118 Personal history of other malignant neoplasm of bronchus and lung: Secondary | ICD-10-CM | POA: Insufficient documentation

## 2023-10-28 DIAGNOSIS — C419 Malignant neoplasm of bone and articular cartilage, unspecified: Secondary | ICD-10-CM | POA: Insufficient documentation

## 2023-10-28 DIAGNOSIS — Z8379 Family history of other diseases of the digestive system: Secondary | ICD-10-CM | POA: Insufficient documentation

## 2023-10-28 DIAGNOSIS — Z8249 Family history of ischemic heart disease and other diseases of the circulatory system: Secondary | ICD-10-CM | POA: Insufficient documentation

## 2023-10-28 DIAGNOSIS — Z803 Family history of malignant neoplasm of breast: Secondary | ICD-10-CM | POA: Insufficient documentation

## 2023-10-28 DIAGNOSIS — Z87891 Personal history of nicotine dependence: Secondary | ICD-10-CM | POA: Diagnosis not present

## 2023-10-28 NOTE — Patient Instructions (Addendum)
[facility] at [facility] Discharge Instructions   You were seen and examined today by Dr. Ellin Saba.  The tumor has grown markedly since we saw you a year and 2 months ago.   Continue your pain medication regimen as you have been doing.  Continue to see hospice weekly.    Please call the clinic if you need anything.    Thank you for choosing [facility] at [facility] to provide your oncology and hematology care.  To afford each patient quality time with our provider, please arrive at least 15 minutes before your scheduled appointment time.   If you have a lab appointment with the Cancer Center please come in thru the Main Entrance and check in at the main information desk.  You need to re-schedule your appointment should you arrive 10 or more minutes late.  We strive to give you quality time with our providers, and arriving late affects you and other patients whose appointments are after yours.  Also, if you no show three or more times for appointments you may be dismissed from the clinic at the providers discretion.     Again, thank you for choosing [facility].  Our hope is that these requests will decrease the amount of time that you wait before being seen by our physicians.       _____________________________________________________________  Should you have questions after your visit to [facility], please contact our office at [phone] and follow the prompts.  Our office hours are 8:00 a.m. and 4:30 p.m. Monday - Friday.  Please note that voicemails left after 4:00 p.m. may not be returned until the following business day.  We are closed weekends and major holidays.  You do have access to a nurse 24-7, just call the main number to the clinic [phone] and do not press any options, hold on the line and a nurse will answer the phone.    For prescription refill requests, have your pharmacy contact our office  and allow 72 hours.    Due to Covid, you will need to wear a mask upon entering the hospital. If you do not have a mask, a mask will be given to you at the Main Entrance upon arrival. For doctor visits, patients may have 1 support person age 56 or older with them. For treatment visits, patients can not have anyone with them due to social distancing guidelines and our immunocompromised population.

## 2024-04-20 NOTE — Progress Notes (Signed)
 Erroneous encounter
